# Patient Record
Sex: Male | Born: 1950 | Race: Black or African American | Hispanic: No | Smoking: Never smoker
Health system: Southern US, Community
[De-identification: ages and names within clinical notes are randomized; demographics above are authoritative.]

## PROBLEM LIST (undated history)

## (undated) DIAGNOSIS — M109 Gout, unspecified: Secondary | ICD-10-CM

## (undated) DIAGNOSIS — E785 Hyperlipidemia, unspecified: Secondary | ICD-10-CM

## (undated) DIAGNOSIS — I2510 Atherosclerotic heart disease of native coronary artery without angina pectoris: Secondary | ICD-10-CM

## (undated) HISTORY — PX: TONSILLECTOMY: SUR1361

---

## 2005-09-03 HISTORY — PX: CATARACT EXTRACTION W/ INTRAOCULAR LENS  IMPLANT, BILATERAL: SHX1307

## 2006-09-03 HISTORY — PX: CORONARY ANGIOPLASTY WITH STENT PLACEMENT: SHX49

## 2011-11-15 ENCOUNTER — Emergency Department (HOSPITAL_COMMUNITY)
Admission: EM | Admit: 2011-11-15 | Discharge: 2011-11-15 | Disposition: A | Discharge status: Home / Self Care | Payer: BC Managed Care – PPO | Attending: Emergency Medicine | Admitting: Emergency Medicine

## 2011-11-15 ENCOUNTER — Encounter (HOSPITAL_COMMUNITY): Payer: Self-pay | Admitting: *Deleted

## 2011-11-15 DIAGNOSIS — M1A9XX0 Chronic gout, unspecified, without tophus (tophi): Secondary | ICD-10-CM

## 2011-11-15 DIAGNOSIS — M109 Gout, unspecified: Secondary | ICD-10-CM | POA: Insufficient documentation

## 2011-11-15 DIAGNOSIS — J069 Acute upper respiratory infection, unspecified: Secondary | ICD-10-CM

## 2011-11-15 MED ORDER — INDOMETHACIN 25 MG PO CAPS: 25.0000 mg | ORAL_CAPSULE | Freq: Three times a day (TID) | ORAL | Status: AC | PRN

## 2011-11-15 NOTE — ED Provider Notes (Signed)
History     CSN: [id number]  Arrival date & time 11/15/11  1533   First MD Initiated Contact with Patient 11/15/11 1625      Chief Complaint  Patient presents with  . Elbow Pain  . Nasal Congestion    (Consider location/radiation/quality/duration/timing/severity/associated sxs/prior treatment) Patient is a 61 y.o. male presenting with extremity pain. The history is provided by the patient. No language interpreter was used.  Extremity Pain This is a chronic problem. The current episode started in the past 7 days. The problem occurs intermittently. The problem has been gradually worsening. Associated symptoms include congestion, coughing and a sore throat. Pertinent negatives include no chest pain, chills, fever or myalgias. The symptoms are aggravated by bending. He has tried nothing for the symptoms.  Patient with history of gout affecting elbow.  Has recently relocated to this area, does not have a PCP.  Has used indocin for gout pain in past with good results.  Also complaining of URI symptoms x 1 week.  Denies fever, chills.    Past Medical History  Diagnosis Date  . Gout     History reviewed. No pertinent past surgical history.  History reviewed. No pertinent family history.  History  Substance Use Topics  . Smoking status: Never Smoker   . Smokeless tobacco: Not on file  . Alcohol Use: Yes      Review of Systems  Constitutional: Negative for fever and chills.  HENT: Positive for congestion and sore throat.   Respiratory: Positive for cough.   Cardiovascular: Negative for chest pain.  Musculoskeletal: Negative for myalgias.  All other systems reviewed and are negative.    Allergies  Review of patient's allergies indicates not on file.  Home Medications  No current outpatient prescriptions on file.  BP 130/84  Pulse 71  Temp(Src) 98.1 F (36.7 C) (Oral)  Resp 18  SpO2 97%  Physical Exam  Nursing note and vitals reviewed. Constitutional: He appears  well-developed and well-nourished.  HENT:  Head: Normocephalic.  Eyes: Conjunctivae are normal. Pupils are equal, round, and reactive to light.  Neck: Normal range of motion.  Cardiovascular: Normal rate, regular rhythm, normal heart sounds and intact distal pulses.   Pulmonary/Chest: Effort normal and breath sounds normal.  Abdominal: Soft. Bowel sounds are normal.  Musculoskeletal: Normal range of motion. He exhibits edema and tenderness.  Lymphadenopathy:    He has no cervical adenopathy.  Skin: Skin is warm and dry.  Psychiatric: He has a normal mood and affect. His behavior is normal. Judgment and thought content normal.    ED Course  Procedures (including critical care time)  Labs Reviewed - No data to display No results found.   No diagnosis found. Gout flare. Viral URI.   MDM          Jimmye Norman, NP 11/15/11 [phone]

## 2011-11-15 NOTE — ED Provider Notes (Signed)
Medical screening examination/treatment/procedure(s) were performed by non-physician practitioner and as supervising physician I was immediately available for consultation/collaboration.  Doug Sou, MD 11/15/11 2032

## 2011-11-15 NOTE — ED Notes (Signed)
Pt reports increased pain to right elbow, reports hx of gout. Pt also reports nasal congestion over the last couple days. Pt speaking in complete sentences, pt using right arm without difficulty.

## 2011-11-15 NOTE — Discharge Instructions (Signed)
Arthritis - Gout Gout is caused by uric acid crystals forming in the joint. The big toe, foot, ankle, and knee are the joints most often affected.Often uric acid levels in the blood are also elevated. Symptoms develop rapidly, usually over hours. A joint fluid exam may be needed to prove the diagnosis. Acute gout episodes may follow a minor injury, surgery, illness or medication change. Gout occurs more often in men. It tends to be an inherited (passed down from a family member) condition. Your chance of having gout is increased if you take certain medications. These include water pills (diuretics), aspirin, niacin, and cyclosporine. Kidney disease and alcohol consumption may also increase your chances of getting gout. Months or years can pass between gout attacks.  Treatment includes ice, rest and raising the affected limb until the swelling and pain are better.Anti-inflammatory medicine usually brings about dramatic relief of pain, redness and swelling within 2 to 3 days. Other medications can also be effective. Increase your fluid intake. Do not drink alcohol or eat liver, sweetbreads, or sardines. Long-term gout management may require medicine to lower blood uric acid levels, or changing or stopping diuretics. Please see your caregiver if your condition is not better after 1 to 2 days of treatment.  SEEK IMMEDIATE MEDICAL CARE IF:  You have more serious symptoms such as a fever, skin rash, diarrhea, vomiting, or other joint pains. Document Released: 09/27/2004 Document Revised: 08/09/2011 Document Reviewed: 10/11/2008 [facility] Patient Information 2012 [place], [place].  Upper Respiratory Infection, Adult An upper respiratory infection (URI) is also known as the common cold. It is often caused by a type of germ (virus). Colds are easily spread (contagious). You can pass it to others by kissing, coughing, sneezing, or drinking out of the same glass. Usually, you get better in 1 or 2 weeks.  HOME CARE    Only take medicine as told by your doctor.   Use a warm mist humidifier or breathe in steam from a hot shower.   Drink enough water and fluids to keep your pee (urine) clear or pale yellow.   Get plenty of rest.   Return to work when your temperature is back to normal or as told by your doctor. You may use a face mask and wash your hands to stop your cold from spreading.  GET HELP RIGHT AWAY IF:   After the first few days, you feel you are getting worse.   You have questions about your medicine.   You have chills, shortness of breath, or brown or red spit (mucus).   You have yellow or brown snot (nasal discharge) or pain in the face, especially when you bend forward.   You have a fever, puffy (swollen) neck, pain when you swallow, or white spots in the back of your throat.   You have a bad headache, ear pain, sinus pain, or chest pain.   You have a high-pitched whistling sound when you breathe in and out (wheezing).   You have a lasting cough or cough up blood.   You have sore muscles or a stiff neck.  MAKE SURE YOU:   Understand these instructions.   Will watch your condition.   Will get help right away if you are not doing well or get worse.  Document Released: 02/06/2008 Document Revised: 08/09/2011 Document Reviewed: 12/25/2010 [facility] Patient Information 2012 [place], [place].  RESOURCE GUIDE  Dental Problems  Patients with Medicaid: [facility]  [facility] [address]                                           [phone] [address] [employer] [address] Phone:  [phone]                                                  Phone:  [phone]  If unable to pay or uninsured, contact:  [facility] or [facility]. to become qualified for the adult dental clinic.  Chronic Pain Problems Contact [facility]  [phone] Patients need to be referred by their primary care doctor.  Insufficient Money for  Medicine Contact [employer] Way:  call "211" or [facility] [phone].  No Primary Care Doctor Call [facility]  [phone] Other agencies that provide inexpensive medical care    [facility]  [phone]    [facility]  [phone]    [facility]  [phone]    [facility]  [phone]    [facility]  [phone]    [facility]  [phone]  Psychological Services [facility] Behavioral [facility]  [phone] [facility]  [phone] [facility] Mental Health   6691699259 (emergency services [phone])  Substance Abuse Resources [facility]  [phone] [facility] [phone] [facility] [phone] Floydene Flock [phone] Residential & Outpatient Substance Abuse Program  2315175097  Abuse/Neglect [facility] Child Abuse Hotline 2628539627 [facility] Child Abuse Hotline 808-855-3793 (After Hours)  Emergency Shelter [facility] 7811651413  Maternity Homes [facility] [phone] [facility] [phone]  MRSA Hotline #:   [phone]    [facility] Resources  [facility] [place] [facility]     United [employer]                          [facility] Dept. [address] [place]                       [address]      [address]  Blondell Reveal Phone:  [phone]                                   Phone:  [phone]                 Phone:  [phone]  [facility] Phone:  [phone]  [facility] Child Abuse Hotline (619)318-2197 707-416-0472 (After Hours)   Take indocin as directed.  You may want to try Robitussin DM for cough.  Use the resource  information provided to help locate an primary care provider.

## 2012-07-23 ENCOUNTER — Encounter (HOSPITAL_COMMUNITY): Payer: Self-pay | Admitting: Physical Medicine and Rehabilitation

## 2012-07-23 ENCOUNTER — Emergency Department (HOSPITAL_COMMUNITY)
Admission: EM | Admit: 2012-07-23 | Discharge: 2012-07-23 | Disposition: A | Discharge status: Home / Self Care | Payer: BC Managed Care – PPO | Attending: Emergency Medicine | Admitting: Emergency Medicine

## 2012-07-23 DIAGNOSIS — M25569 Pain in unspecified knee: Secondary | ICD-10-CM | POA: Insufficient documentation

## 2012-07-23 DIAGNOSIS — Z7982 Long term (current) use of aspirin: Secondary | ICD-10-CM | POA: Insufficient documentation

## 2012-07-23 DIAGNOSIS — M109 Gout, unspecified: Secondary | ICD-10-CM

## 2012-07-23 MED ORDER — INDOMETHACIN 25 MG PO CAPS
50.0000 mg | ORAL_CAPSULE | Freq: Once | ORAL | Status: AC
  Administered 2012-07-23: 50 mg via ORAL
  Filled 2012-07-23 (×2): qty 2

## 2012-07-23 MED ORDER — INDOMETHACIN 50 MG PO CAPS: 50.0000 mg | ORAL_CAPSULE | Freq: Three times a day (TID) | ORAL | Status: DC

## 2012-07-23 NOTE — ED Notes (Signed)
Patient says he was diagnosed with gout since he was 30.  He started feeling pain in his right shoulder and his right knee.  Patient said he just recently moved here from [place] and does not have a primary care physician and his pain is so severe he decided to come in.

## 2012-07-23 NOTE — ED Provider Notes (Signed)
History    This chart was scribed for Adrian Guppy, MD, MD by Smitty Pluck, ED Scribe. The patient was seen in room TR05C and the patient's care was started at 5:37PM.   CSN: [id number]  Arrival date & time 07/23/12  1625   None     Chief Complaint  Patient presents with  . Shoulder Pain  . Knee Pain    (Consider location/radiation/quality/duration/timing/severity/associated sxs/prior treatment) Patient is a 61 y.o. male presenting with shoulder pain and knee pain. The history is provided by the patient. No language interpreter was used.  Shoulder Pain Pertinent negatives include no shortness of breath.  Knee Pain Pertinent negatives include no shortness of breath.   Naszir [name] is a 61 y.o. male with hx of gout who presents to the Emergency Department complaining of constant, moderate right shoulder pain and right knee pain onset 2 days ago. He states that he normally has gout flare ups. Pt denies trauma to shoulder and knee. He reports that he ran out of gout medication 1 day ago.  He denies fever, chills, nausea, vomiting and any other pain.  Past Medical History  Diagnosis Date  . Gout     Past Surgical History  Procedure Date  . Coronary angioplasty with stent placement     History reviewed. No pertinent family history.  History  Substance Use Topics  . Smoking status: Never Smoker   . Smokeless tobacco: Not on file  . Alcohol Use: Yes      Review of Systems  Constitutional: Negative for fever and chills.  Respiratory: Negative for shortness of breath.   Gastrointestinal: Negative for nausea and vomiting.  Neurological: Negative for weakness.  All other systems reviewed and are negative.    Allergies  Review of patient's allergies indicates no known allergies.  Home Medications   Current Outpatient Rx  Name  Route  Sig  Dispense  Refill  . ASPIRIN EC 81 MG PO TBEC   Oral   Take 81 mg by mouth every morning.         Marland Kitchen NAPROXEN 375 MG PO  TABS   Oral   Take 375 mg by mouth 2 (two) times daily as needed. For gout           BP 132/86  Pulse 66  Temp 98.7 F (37.1 C) (Oral)  Resp 18  SpO2 100%  Physical Exam  Nursing note and vitals reviewed.   ED Course  Procedures (including critical care time) DIAGNOSTIC STUDIES: Oxygen Saturation is 100% on room air, normal by my interpretation.    COORDINATION OF CARE: 5:40 PM Discussed ED treatment with pt  5:40 PM Ordered:    . indomethacin  50 mg Oral Once       Labs Reviewed - No data to display No results found.   No diagnosis found.    MDM  gout      I personally performed the services described in this documentation, which was scribed in my presence. The recorded information has been reviewed and is accurate.     Adrian Guppy, MD 07/23/12 1742

## 2012-07-23 NOTE — ED Notes (Signed)
Pt presents to department for evaluation of R shoulder and R knee pain. States history of gout. He is new to town and does not have PCP, states he ran out of medication (Indomethacin). 10/10 pain upon arrival. Pt is conscious alert and oriented x4. Ambulatory to triage. NAD.

## 2013-07-15 ENCOUNTER — Emergency Department (HOSPITAL_COMMUNITY): Payer: BC Managed Care – PPO

## 2013-07-15 ENCOUNTER — Observation Stay (HOSPITAL_COMMUNITY)
Admission: EM | Admit: 2013-07-15 | Discharge: 2013-07-17 | Disposition: A | Discharge status: Home / Self Care | Payer: BC Managed Care – PPO | Attending: Cardiovascular Disease | Admitting: Cardiovascular Disease

## 2013-07-15 ENCOUNTER — Encounter (HOSPITAL_COMMUNITY): Payer: Self-pay | Admitting: Emergency Medicine

## 2013-07-15 DIAGNOSIS — R0602 Shortness of breath: Secondary | ICD-10-CM | POA: Insufficient documentation

## 2013-07-15 DIAGNOSIS — I209 Angina pectoris, unspecified: Secondary | ICD-10-CM

## 2013-07-15 DIAGNOSIS — I200 Unstable angina: Secondary | ICD-10-CM | POA: Insufficient documentation

## 2013-07-15 DIAGNOSIS — Z7902 Long term (current) use of antithrombotics/antiplatelets: Secondary | ICD-10-CM | POA: Insufficient documentation

## 2013-07-15 DIAGNOSIS — E785 Hyperlipidemia, unspecified: Secondary | ICD-10-CM | POA: Diagnosis present

## 2013-07-15 DIAGNOSIS — Z9861 Coronary angioplasty status: Secondary | ICD-10-CM | POA: Insufficient documentation

## 2013-07-15 DIAGNOSIS — I208 Other forms of angina pectoris: Secondary | ICD-10-CM | POA: Insufficient documentation

## 2013-07-15 DIAGNOSIS — I2510 Atherosclerotic heart disease of native coronary artery without angina pectoris: Principal | ICD-10-CM | POA: Insufficient documentation

## 2013-07-15 HISTORY — DX: Atherosclerotic heart disease of native coronary artery without angina pectoris: I25.10

## 2013-07-15 HISTORY — DX: Hyperlipidemia, unspecified: E78.5

## 2013-07-15 HISTORY — DX: Gout, unspecified: M10.9

## 2013-07-15 LAB — BASIC METABOLIC PANEL
BUN: 14 mg/dL (ref 6–23)
CO2: 22 mEq/L (ref 19–32)
Calcium: 9.3 mg/dL (ref 8.4–10.5)
Chloride: 102 mEq/L (ref 96–112)
Creatinine, Ser: 1.17 mg/dL (ref 0.50–1.35)
GFR calc Af Amer: 75 mL/min — ABNORMAL LOW (ref >90)
GFR calc non Af Amer: 65 mL/min — ABNORMAL LOW (ref >90)
Glucose, Bld: 89 mg/dL (ref 70–99)
Potassium: 4.1 mEq/L (ref 3.5–5.1)
Sodium: 137 mEq/L (ref 135–145)

## 2013-07-15 LAB — CBC
Hemoglobin: 14.7 g/dL (ref 13.0–17.0)
MCH: 33 pg (ref 26.0–34.0)
MCHC: 35.1 g/dL (ref 30.0–36.0)
MCHC: 34.7 g/dL (ref 30.0–36.0)
MCV: 93.9 fL (ref 78.0–100.0)
Platelets: 142 10*3/uL — ABNORMAL LOW (ref 150–400)
RBC: 4.54 MIL/uL (ref 4.22–5.81)
RDW: 13.2 % (ref 11.5–15.5)
WBC: 3.2 10*3/uL — ABNORMAL LOW (ref 4.0–10.5)

## 2013-07-15 LAB — CREATININE, SERUM
Creatinine, Ser: 1.25 mg/dL (ref 0.50–1.35)
GFR calc Af Amer: 70 mL/min — ABNORMAL LOW (ref >90)
GFR calc non Af Amer: 60 mL/min — ABNORMAL LOW (ref >90)

## 2013-07-15 LAB — TROPONIN I: Troponin I: <0.3 ng/mL (ref <0.30)

## 2013-07-15 LAB — POCT I-STAT TROPONIN I: Troponin i, poc: 0.01 ng/mL (ref 0.00–0.08)

## 2013-07-15 LAB — PRO B NATRIURETIC PEPTIDE: Pro B Natriuretic peptide (BNP): 53.1 pg/mL (ref 0–125)

## 2013-07-15 MED ORDER — ASPIRIN 81 MG PO CHEW
324.0000 mg | CHEWABLE_TABLET | ORAL | Status: AC
  Administered 2013-07-15: 324 mg via ORAL
  Filled 2013-07-15: qty 4

## 2013-07-15 MED ORDER — ATORVASTATIN CALCIUM 10 MG PO TABS
10.0000 mg | ORAL_TABLET | Freq: Every day | ORAL | Status: DC
  Administered 2013-07-15: 10 mg via ORAL
  Filled 2013-07-15 (×3): qty 1

## 2013-07-15 MED ORDER — ONDANSETRON HCL 4 MG/2ML IJ SOLN: 4.0000 mg | Freq: Four times a day (QID) | INTRAMUSCULAR | Status: DC | PRN

## 2013-07-15 MED ORDER — ASPIRIN EC 81 MG PO TBEC
81.0000 mg | DELAYED_RELEASE_TABLET | Freq: Every day | ORAL | Status: DC
  Administered 2013-07-17: 10:00:00 81 mg via ORAL
  Filled 2013-07-15 (×2): qty 1

## 2013-07-15 MED ORDER — ACETAMINOPHEN 325 MG PO TABS: 650.0000 mg | ORAL_TABLET | ORAL | Status: DC | PRN

## 2013-07-15 MED ORDER — ASPIRIN 300 MG RE SUPP: 300.0000 mg | RECTAL | Status: AC

## 2013-07-15 MED ORDER — ZOLPIDEM TARTRATE 5 MG PO TABS: 5.0000 mg | ORAL_TABLET | Freq: Every evening | ORAL | Status: DC | PRN

## 2013-07-15 MED ORDER — COLCHICINE 0.6 MG PO TABS
0.6000 mg | ORAL_TABLET | Freq: Three times a day (TID) | ORAL | Status: DC | PRN
  Filled 2013-07-15: qty 1

## 2013-07-15 MED ORDER — ALPRAZOLAM 0.25 MG PO TABS: 0.2500 mg | ORAL_TABLET | Freq: Two times a day (BID) | ORAL | Status: DC | PRN

## 2013-07-15 MED ORDER — NITROGLYCERIN 0.4 MG SL SUBL: 0.4000 mg | SUBLINGUAL_TABLET | SUBLINGUAL | Status: DC | PRN

## 2013-07-15 MED ORDER — ENOXAPARIN SODIUM 40 MG/0.4ML ~~LOC~~ SOLN
40.0000 mg | SUBCUTANEOUS | Status: DC
  Administered 2013-07-15: 40 mg via SUBCUTANEOUS
  Filled 2013-07-15 (×2): qty 0.4

## 2013-07-15 NOTE — ED Provider Notes (Signed)
CSN: [id number]     Arrival date & time 07/15/13  1114 History   First MD Initiated Contact with Patient 07/15/13 1124     Chief Complaint  Patient presents with  . Chest Pain   (Consider location/radiation/quality/duration/timing/severity/associated sxs/prior Treatment) HPI Comments: Patient presents emergency department with chief complaint of chest pain. He states that he has had chest pain for the past several days, but that he only gets it when he is exercising. He also endorses shortness of breath with exercising. Patient states that he does have a history of CAD, and had a stent placed in 2008. This was done in [place]. He does not have a cardiologist here. Currently, patient denies any chest pain or shortness of breath. He states that he does want to be evaluated. He takes an aspirin daily. States that the symptoms are always worsened with exercising or stress, and they're always relieved with rest.  The history is provided by the patient. No language interpreter was used.    Past Medical History  Diagnosis Date  . Gout   . Coronary artery disease   . Gout    Past Surgical History  Procedure Laterality Date  . Coronary angioplasty with stent placement     No family history on file. History  Substance Use Topics  . Smoking status: Never Smoker   . Smokeless tobacco: Not on file  . Alcohol Use: Yes    Review of Systems  All other systems reviewed and are negative.    Allergies  Review of patient's allergies indicates no known allergies.  Home Medications   Current Outpatient Rx  Name  Route  Sig  Dispense  Refill  . aspirin EC 81 MG tablet   Oral   Take 81 mg by mouth every morning.         . colchicine 0.6 MG tablet   Oral   Take 0.6 mg by mouth 3 (three) times daily as needed. For gout         . indomethacin (INDOCIN) 50 MG capsule   Oral   Take 1 capsule (50 mg total) by mouth 3 (three) times daily with meals.   60 capsule   6    BP 122/84  Pulse  64  Temp(Src) 97.4 F (36.3 C) (Oral)  Resp 18  SpO2 99% Physical Exam  Nursing note and vitals reviewed. Constitutional: He is oriented to person, place, and time. He appears well-developed and well-nourished.  HENT:  Head: Normocephalic and atraumatic.  Right Ear: External ear normal.  Left Ear: External ear normal.  Nose: Nose normal.  Mouth/Throat: Oropharynx is clear and moist. No oropharyngeal exudate.  Eyes: Conjunctivae and EOM are normal. Pupils are equal, round, and reactive to light. Right eye exhibits no discharge. Left eye exhibits no discharge. No scleral icterus.  Neck: Normal range of motion. Neck supple. No JVD present.  Cardiovascular: Normal rate, regular rhythm, normal heart sounds and intact distal pulses.  Exam reveals no gallop and no friction rub.   No murmur heard. Pulmonary/Chest: Effort normal and breath sounds normal. No respiratory distress. He has no wheezes. He has no rales. He exhibits no tenderness.  Abdominal: Soft. Bowel sounds are normal. He exhibits no distension and no mass. There is no tenderness. There is no rebound and no guarding.  Musculoskeletal: Normal range of motion. He exhibits no edema and no tenderness.  Neurological: He is alert and oriented to person, place, and time.  Skin: Skin is warm and dry.  Psychiatric: He has a normal mood and affect. His behavior is normal. Judgment and thought content normal.    ED Course  Procedures (including critical care time) Results for orders placed during the hospital encounter of 07/15/13  CBC      Result Value Range   WBC 3.3 (*) 4.0 - 10.5 K/uL   RBC 4.79  4.22 - 5.81 MIL/uL   Hemoglobin 15.8  13.0 - 17.0 g/dL   HCT 16.1  09.6 - 04.5 %   MCV 93.9  78.0 - 100.0 fL   MCH 33.0  26.0 - 34.0 pg   MCHC 35.1  30.0 - 36.0 g/dL   RDW 40.9  81.1 - 91.4 %   Platelets 142 (*) 150 - 400 K/uL  BASIC METABOLIC PANEL      Result Value Range   Sodium 137  135 - 145 mEq/L   Potassium 4.1  3.5 - 5.1  mEq/L   Chloride 102  96 - 112 mEq/L   CO2 22  19 - 32 mEq/L   Glucose, Bld 89  70 - 99 mg/dL   BUN 14  6 - 23 mg/dL   Creatinine, Ser 7.82  0.50 - 1.35 mg/dL   Calcium 9.3  8.4 - 95.6 mg/dL   GFR calc non Af Amer 65 (*) >90 mL/min   GFR calc Af Amer 75 (*) >90 mL/min  PRO B NATRIURETIC PEPTIDE      Result Value Range   Pro B Natriuretic peptide (BNP) 53.1  0 - 125 pg/mL  POCT I-STAT TROPONIN I      Result Value Range   Troponin i, poc 0.01  0.00 - 0.08 ng/mL   Comment 3            Dg Chest 2 View  07/15/2013   CLINICAL DATA:  Chest pain and dyspnea  EXAM: CHEST  2 VIEW  COMPARISON:  None.  FINDINGS: The lungs are borderline hypoinflated. There is no focal infiltrate. The cardiac silhouette is top-normal in size. The pulmonary vascularity is not engorged. There is no pleural effusion or pneumothorax. There is calcification of the anterior longitudinal ligament of the thoracic spine.  IMPRESSION: There is no evidence of pneumonia or CHF. The lungs appear mildly hypoinflated.   Electronically Signed   By: David  [place]   On: 07/15/2013 13:07      EKG Interpretation   None     ED ECG REPORT  I personally interpreted this EKG   Date: 07/15/2013   Rate: 62  Rhythm: normal sinus rhythm  QRS Axis: normal  Intervals: normal  ST/T Wave abnormalities: normal  Conduction Disutrbances:none  Narrative Interpretation: Possible anterior infarct, age undertermined  Old EKG Reviewed: none available    MDM   1. Angina of effort     Patient with chest pain which is exertional. He's not having any chest pain now. Check basic labs, EKG, and chest x-ray. Will reevaluate.  Labs are reassuring.  Patient discussed with Dr. Wilkie Aye.  Will call cardiology for consult.  Patient will be admitted by cardiology, Dr. Eden Emms.  Cath in the morning.    Roxy Horseman, PA-C 07/15/13 1523

## 2013-07-15 NOTE — ED Notes (Signed)
Cp with exersizing and sob x 3 days pt states that it comes and goes has a stent 08 ( has had no f/u since )

## 2013-07-15 NOTE — H&P (Signed)
History and Physical   Patient ID: Divonte Senger MRN: [id number], DOB/AGE: 62-Oct-1952 63 y.o. Date of Encounter: 07/15/2013  Primary Physician: None here Primary Cardiologist: New  Chief Complaint:  Chest pain  HPI: Less Woolsey is a 62 y.o. male with a history of CAD. Stenting in 2008, no cath or stress test since then.   He recently moved to [place]. He has started back exercising, after not exercising in over a year. For the last 2 weeks, he has been walking 5 miles at a time and also doing some weights and abdominal exercises. He is also active around the house and yard, mowing and using a weedeater.  Ever since he started increasing his activity, he has noticed chest tightness with exertion. A 3/10,  The tightness has been coming on pretty consistently with exertion and resolves with rest. He has had no resting pain. If he thinks about the situation when he did not get chest tightness, he will realize he was not exerting himself quite as much. He gets shortness of breath with the chest tightness, which does not exceed a 5/10. He had chest tightness 2 days ago while doing his wall and then again last p.m., during sex. He and his girlfriend were concerned and she persuaded him to come to the emergency room today.    Past Medical History  Diagnosis Date  . Gout   . Coronary artery disease 2008    Slight heart attack, > 90% "main artery", s/p stent    Surgical History:  Past Surgical History  Procedure Laterality Date  . Coronary angioplasty with stent placement  2008    [facility] [place], 1 stent, possibly to the LAD     I have reviewed the patient's current medications. Prior to Admission medications   Medication Sig Start Date End Date Taking? Authorizing Provider  aspirin EC 81 MG tablet Take 81 mg by mouth every morning.   Yes Historical Provider, MD  colchicine 0.6 MG tablet Take 0.6 mg by mouth 3 (three) times daily as needed. For gout   Yes  Historical Provider, MD  indomethacin (INDOCIN) 50 MG capsule Take 1 capsule (50 mg total) by mouth 3 (three) times daily with meals. 07/23/12  Yes Cheri Guppy, MD   Allergies: No Known Allergies  History   Social History  . Marital Status: Divorced    Spouse Name: N/A    Number of Children: N/A  . Years of Education: N/A   Occupational History  . Retired 31 years with [employer]    Social History Main Topics  . Smoking status: Never Smoker   . Smokeless tobacco: Not on file  . Alcohol Use: Yes     Comment: 1-2 glasses on the weekends  . Drug Use: No  . Sexual Activity: Not on file   Other Topics Concern  . Not on file   Social History Narrative   Pt lives alone. Daughter lives in the area.    Family History  Problem Relation Age of Onset  . Heart attack Father   . Coronary artery disease Mother    Family Status  Relation Status Death Age  . Mother Deceased 68    Hx CAD  . Father Deceased 72    MI    Review of Systems:   Full 14-point review of systems otherwise negative except as noted above.  Physical Exam: Blood pressure 122/84, pulse 64, temperature 97.4 F (36.3 C), temperature source Oral, resp. rate  18, SpO2 99.00%. General: Well developed, well nourished,male in no acute distress. Head: Normocephalic, atraumatic, sclera non-icteric, no xanthomas, nares are without discharge. Dentition: good Neck: No carotid bruits. JVD not elevated. No thyromegally Lungs: Good expansion bilaterally. without wheezes or rhonchi.  Heart: Regular rate and rhythm with S1 S2.  No S3 or S4.  No murmur, no rubs, or gallops appreciated. Abdomen: Soft, non-tender, non-distended with normoactive bowel sounds. No hepatomegaly. No rebound/guarding. No obvious abdominal masses. Msk:  Strength and tone appear normal for age. No joint deformities or effusions, no spine or costo-vertebral angle tenderness. Extremities: No clubbing or cyanosis. No edema.  Distal pedal pulses are 2+ in 4  extrem Neuro: Alert and oriented X 3. Moves all extremities spontaneously. No focal deficits noted. Psych:  Responds to questions appropriately with a normal affect. Skin: No rashes or lesions noted  Labs:  Lab Results  Component Value Date   WBC 3.3* 07/15/2013   HGB 15.8 07/15/2013   HCT 45.0 07/15/2013   MCV 93.9 07/15/2013   PLT 142* 07/15/2013     Recent Labs Lab 07/15/13 1145  NA 137  K 4.1  CL 102  CO2 22  BUN 14  CREATININE 1.17  CALCIUM 9.3  GLUCOSE 89   No results found for this basename: CKTOTAL, CKMB, TROPONINI,  in the last 72 hours  Recent Labs  07/15/13 1213  TROPIPOC 0.01   Pro B Natriuretic peptide (BNP)  Date/Time Value Range Status  07/15/2013 11:45 AM 53.1  0 - 125 pg/mL Final    Radiology/Studies: Dg Chest 2 View 07/15/2013   CLINICAL DATA:  Chest pain and dyspnea  EXAM: CHEST  2 VIEW  COMPARISON:  None.  FINDINGS: The lungs are borderline hypoinflated. There is no focal infiltrate. The cardiac silhouette is top-normal in size. The pulmonary vascularity is not engorged. There is no pleural effusion or pneumothorax. There is calcification of the anterior longitudinal ligament of the thoracic spine.  IMPRESSION: There is no evidence of pneumonia or CHF. The lungs appear mildly hypoinflated.   Electronically Signed   By: David  [place]   On: 07/15/2013 13:07   ECG:  SR, no ST elevation.  ASSESSMENT AND PLAN:  Active Problems:   Angina of effort - admit, r/o MI. His symptoms are concerning for progressive angina, cath is best option. Will schedule for AM. Screen for CRF control. Chol: Unknown check fasting lipid profile.   Thrombocytopenia:  Denies ETOH excess no splenomegaly follow

## 2013-07-16 ENCOUNTER — Encounter (HOSPITAL_COMMUNITY)
Admission: EM | Disposition: A | Discharge status: Home / Self Care | Payer: BC Managed Care – PPO | Source: Home / Self Care | Attending: Cardiovascular Disease

## 2013-07-16 DIAGNOSIS — I2510 Atherosclerotic heart disease of native coronary artery without angina pectoris: Secondary | ICD-10-CM

## 2013-07-16 HISTORY — PX: LEFT HEART CATHETERIZATION WITH CORONARY ANGIOGRAM: SHX5451

## 2013-07-16 LAB — PROTIME-INR
INR: 1.09 (ref 0.00–1.49)
Prothrombin Time: 13.9 seconds (ref 11.6–15.2)

## 2013-07-16 LAB — LIPID PANEL
Cholesterol: 261 mg/dL — ABNORMAL HIGH (ref 0–200)
Triglycerides: 111 mg/dL (ref <150)
VLDL: 22 mg/dL (ref 0–40)

## 2013-07-16 LAB — APTT: aPTT: 33 seconds (ref 24–37)

## 2013-07-16 LAB — TROPONIN I: Troponin I: <0.3 ng/mL (ref <0.30)

## 2013-07-16 SURGERY — LEFT HEART CATHETERIZATION WITH CORONARY ANGIOGRAM
Anesthesia: LOCAL

## 2013-07-16 MED ORDER — BIVALIRUDIN 250 MG IV SOLR
INTRAVENOUS | Status: AC
  Filled 2013-07-16: qty 250

## 2013-07-16 MED ORDER — SODIUM CHLORIDE 0.9 % IV SOLN: 250.0000 mL | INTRAVENOUS | Status: DC | PRN

## 2013-07-16 MED ORDER — FAMOTIDINE IN NACL 20-0.9 MG/50ML-% IV SOLN
INTRAVENOUS | Status: AC
  Filled 2013-07-16: qty 50

## 2013-07-16 MED ORDER — SODIUM CHLORIDE 0.9 % IV SOLN
INTRAVENOUS | Status: DC
  Administered 2013-07-16: 06:00:00 via INTRAVENOUS

## 2013-07-16 MED ORDER — MIDAZOLAM HCL 2 MG/2ML IJ SOLN
INTRAMUSCULAR | Status: AC
  Filled 2013-07-16: qty 2

## 2013-07-16 MED ORDER — HEPARIN (PORCINE) IN NACL 2-0.9 UNIT/ML-% IJ SOLN
INTRAMUSCULAR | Status: AC
  Filled 2013-07-16: qty 1500

## 2013-07-16 MED ORDER — ACETAMINOPHEN 325 MG PO TABS: 650.0000 mg | ORAL_TABLET | ORAL | Status: DC | PRN

## 2013-07-16 MED ORDER — FENTANYL CITRATE 0.05 MG/ML IJ SOLN
INTRAMUSCULAR | Status: AC
  Filled 2013-07-16: qty 2

## 2013-07-16 MED ORDER — HEPARIN (PORCINE) IN NACL 100-0.45 UNIT/ML-% IJ SOLN
1000.0000 [IU]/h | INTRAMUSCULAR | Status: DC
  Filled 2013-07-16: qty 250

## 2013-07-16 MED ORDER — HEPARIN (PORCINE) IN NACL 2-0.9 UNIT/ML-% IJ SOLN
INTRAMUSCULAR | Status: AC
  Filled 2013-07-16: qty 1000

## 2013-07-16 MED ORDER — HEPARIN SODIUM (PORCINE) 1000 UNIT/ML IJ SOLN
INTRAMUSCULAR | Status: AC
  Filled 2013-07-16: qty 1

## 2013-07-16 MED ORDER — SODIUM CHLORIDE 0.9 % IJ SOLN
3.0000 mL | Freq: Two times a day (BID) | INTRAMUSCULAR | Status: DC
  Administered 2013-07-16: 3 mL via INTRAVENOUS

## 2013-07-16 MED ORDER — NITROGLYCERIN 0.2 MG/ML ON CALL CATH LAB
INTRAVENOUS | Status: AC
  Filled 2013-07-16: qty 1

## 2013-07-16 MED ORDER — CLOPIDOGREL BISULFATE 300 MG PO TABS
ORAL_TABLET | ORAL | Status: AC
  Filled 2013-07-16: qty 1

## 2013-07-16 MED ORDER — CLOPIDOGREL BISULFATE 75 MG PO TABS
75.0000 mg | ORAL_TABLET | Freq: Every day | ORAL | Status: DC
  Administered 2013-07-17: 09:00:00 75 mg via ORAL
  Filled 2013-07-16: qty 1

## 2013-07-16 MED ORDER — LIDOCAINE HCL (PF) 1 % IJ SOLN
INTRAMUSCULAR | Status: AC
  Filled 2013-07-16: qty 30

## 2013-07-16 MED ORDER — SODIUM CHLORIDE 0.9 % IV SOLN: INTRAVENOUS | Status: AC

## 2013-07-16 MED ORDER — ASPIRIN 81 MG PO CHEW: 324.0000 mg | CHEWABLE_TABLET | ORAL | Status: DC

## 2013-07-16 MED ORDER — SODIUM CHLORIDE 0.9 % IJ SOLN: 3.0000 mL | INTRAMUSCULAR | Status: DC | PRN

## 2013-07-16 MED ORDER — ASPIRIN 81 MG PO CHEW
324.0000 mg | CHEWABLE_TABLET | ORAL | Status: AC
  Administered 2013-07-16: 324 mg via ORAL
  Filled 2013-07-16 (×2): qty 4

## 2013-07-16 MED ORDER — NITROGLYCERIN IN D5W 200-5 MCG/ML-% IV SOLN
INTRAVENOUS | Status: AC
  Filled 2013-07-16: qty 250

## 2013-07-16 MED ORDER — MORPHINE SULFATE 2 MG/ML IJ SOLN: 2.0000 mg | INTRAMUSCULAR | Status: DC | PRN

## 2013-07-16 MED ORDER — ONDANSETRON HCL 4 MG/2ML IJ SOLN: 4.0000 mg | Freq: Four times a day (QID) | INTRAMUSCULAR | Status: DC | PRN

## 2013-07-16 MED ORDER — VERAPAMIL HCL 2.5 MG/ML IV SOLN
INTRAVENOUS | Status: AC
  Filled 2013-07-16: qty 2

## 2013-07-16 MED ORDER — OXYCODONE-ACETAMINOPHEN 5-325 MG PO TABS
1.0000 | ORAL_TABLET | ORAL | Status: DC | PRN
  Administered 2013-07-16: 1 via ORAL
  Filled 2013-07-16: qty 1

## 2013-07-16 NOTE — Interval H&P Note (Signed)
History and Physical Interval Note:  07/16/2013 11:02 AM  Adrian [name]  has presented today for surgery, with the diagnosis of USACath Lab Visit (complete for each Cath Lab visit)  Clinical Evaluation Leading to the Procedure:   ACS: yes  Non-ACS:    Anginal Classification: CCS III  Anti-ischemic medical therapy: Minimal Therapy (1 class of medications)  Non-Invasive Test Results: No non-invasive testing performed  Prior CABG: No previous CABG       The various methods of treatment have been discussed with the patient and family. After consideration of risks, benefits and other options for treatment, the patient has consented to  Procedure(s): LEFT HEART CATHETERIZATION WITH CORONARY ANGIOGRAM (N/A) as a surgical intervention .  The patient's history has been reviewed, patient examined, no change in status, stable for surgery.  I have reviewed the patient's chart and labs.  Questions were answered to the patient's satisfaction.     Adrian [name]

## 2013-07-16 NOTE — CV Procedure (Signed)
Cardiac Cath Procedure Note:  Indication: [place]  Procedures performed:  1) Selective coronary angiography 2) Left heart catheterization 3) Left ventriculogram  Description of procedure:   The risks and indication of the procedure were explained. Consent was signed and placed on the chart. An appropriate timeout was taken prior to the procedure. After a normal Allen's test was confirmed, the right wrist was prepped and draped in the routine sterile fashion and anesthetized with 1% local lidocaine.   A 5 FR arterial sheath was then placed in the right radial artery using a modified Seldinger technique. Systemic heparin was administered. 3mg  IV verapamil was given through the sheath. Standard catheters including a JL 3.5, JR4 and straight pigtail were used. All catheter exchanges were made over a wire.  Complications:  None apparent  Findings:  Ao Pressure: 130/76/98 LV Pressure: 136/15/19 There was no signficant gradient across the aortic valve on pullback.  Left main: Normal  LAD: Long vessel courses to apex. 80% proximal lesion. Diffuse 30% plaque through midvessel with focal intramyocardial segment. Distal vessel with moderate diffuse disease. D1 small 95% ostial. Diffuse disease. D2,D3,D4 small without high-grad stenosis.   LCX: Moderate to large sized co-dominant vessel. AV groove 30-40% lesion in midsection .OM-1 long moderate-sized vessel.with long 50-60% lesion proximally in stent. OM-2 long moderate-sized vessel 99% ostial lesion followed by long 80-90% diffusely diseased segment in proximal to mid section. Moderate plaque in distal vessel. OM-3 small 60% ostial. PL and PDA very small.   RCA: Small co-dominant vessel. 30% lesion prior to PDA. Mild disease distally  LV-gram done in the RAO projection: Ejection fraction = 45-50% with HK basal to mid inferior wall  Assessment: 1) 2V CAD 2) EF 45-50%   Plan/Discussion:  Plan PCI of LAD and OM-2 with Dr. Clifton James. Load  Plavix. Aggressive RF modification.   Adrian [name] 12:02 PM

## 2013-07-16 NOTE — Progress Notes (Signed)
ANTICOAGULATION CONSULT NOTE - Initial Consult  Pharmacy Consult for heparin Indication: chest pain/ACS  No Known Allergies  Patient Measurements: Height: 5\' 10"  (177.8 cm) Weight: 208 lb 3.2 oz (94.439 kg) IBW/kg (Calculated) : 73 Heparin Dosing Weight: 85kg  Vital Signs: Temp: 98 F (36.7 C) (11/13 0501) Temp src: Oral (11/13 0501) BP: 122/69 mmHg (11/13 0501) Pulse Rate: 49 (11/13 1118)  Labs:  Recent Labs  07/15/13 0315 07/15/13 1145 07/15/13 1532 07/16/13 0230  HGB  --  15.8 14.7  --   HCT  --  45.0 42.4  --   PLT  --  142* 124*  --   APTT  --   --   --  33  LABPROT  --   --   --  13.9  INR  --   --   --  1.09  CREATININE  --  1.17 1.25  --   TROPONINI <0.30  --  <0.30 <0.30    Estimated Creatinine Clearance: 70.7 ml/min (by C-G formula based on Cr of 1.25).   Medical History: Past Medical History  Diagnosis Date  . Gout   . Coronary artery disease 2008    Slight heart attack, > 90% "main artery", s/p stent  . Myocardial infarction ~ 2008    "mild; that's when I got my stent" (07/15/2013)  Assessment: 62 y.o. male with a history of CAD. Stenting in 2008. Received orders from cath lab to start IV heparin after diagnostic cath revealed 2v CAD. Plan is for PCI of LAD/OM. CBC within normal limits, no bolus will be given as patient received heparin during cath.  Goal of Therapy:  Heparin level 0.3-0.7 units/ml Monitor platelets by anticoagulation protocol: Yes   Plan:  Start heparin infusion at 1000 units/hr Check anti-Xa level in 6 hours and daily while on heparin Continue to monitor H&H and platelets  Sheppard Coil PharmD., BCPS Clinical Pharmacist Pager [phone] 07/16/2013 2:52 PM

## 2013-07-16 NOTE — ED Provider Notes (Signed)
Medical screening examination/treatment/procedure(s) were performed by non-physician practitioner and as supervising physician I was immediately available for consultation/collaboration.  EKG Interpretation     Ventricular Rate:    PR Interval:    QRS Duration:   QT Interval:    QTC Calculation:   R Axis:     Text Interpretation:             EKG: Sinus rhythm, possible old anterior infarct, no prior for comparison    Shon Baton, MD 07/16/13 [phone]

## 2013-07-16 NOTE — CV Procedure (Signed)
     Cardiac Catheterization Operative Report  Adrian [name] [id number] 11/13/20143:05 PM No PCP Per Patient  Procedure Performed:  1. PTCA/DES x 1 proximal LAD 2. PTCA/DES x 1 OM2   Operator: Verne Carrow, MD  Indication:  62 yo male with unstable angina. Diagnostic cath this am per Dr. Gala Romney with severe stenosis proximal LAD and severe stenosis second OM branch. Both vessels were felt to be severely diseased and potential culprits for his angina.                                     Procedure Details: The risks, benefits, complications, treatment options, and expected outcomes were discussed with the patient. The patient and/or family concurred with the proposed plan, giving informed consent before the diagnostic portion of the procedure. The patient was brought to the cath lab from the holding area where he was taken after his diagnostic cath. A 5/6 French sheath was present in the right radial artery. The patient was further sedated with Versed and Fentanyl. The right wrist was prepped and draped in a sterile fashion. The sheath was exchanged for another 5/6 slender sheath. He was given a weight based bolus of Angiomax and a drip was started. He had been previously loaded with Plavix 600 mg po x 1 earlier today. When the ACT was over 200, I engaged the left main with a XB LAD 3.5 guiding catheter. I then passed a Cougar IC wire down the LAD. A 2.5 x 12 mm balloon was used to pre-dilate the severe stenosis in the proximal LAD. I then carefully positioned and deployed a 3.0 x 18 mm Xience DES in the proximal LAD. The stent was post-dilated with a 3.25 x 15 mm [place] balloon. The hazy stenosis was taken from 80% down to 0%. I then turned by attention to the stenosis in the second OM branch. The same Cougar wire was advanced into the second OM branch. A 2.0 x 20 mm balloon was inflated x 2. I then carefully positioned and deployed a 2.25 x 32 mm Promus Premier DES in the second OM branch of  the Circumflex. I elected not to post-dilate the stent given the appearance after deployment (good size for the vessel with good step-down). The stenosis was taken from 99% down to 0%.   There were no immediate complications. The patient was taken to the recovery area in stable condition.   Hemodynamic Findings: Central aortic pressure: 155/91  Impression: 1. Unstable angina with severe double vessel CAD 2. Severe stenosis proximal LAD, now s/p successful PTCA/DES x 1 proximal LAD 3. Severe stenosis proximal OM2, now s/p successful PTCA/DES x 1 OM2  Recommendations: Continue ASA and Plavix for one year. Continue statin. No beta blocker with bradycardia.        Complications:  None; patient tolerated the procedure well.

## 2013-07-17 ENCOUNTER — Encounter (HOSPITAL_COMMUNITY): Payer: Self-pay | Admitting: Nurse Practitioner

## 2013-07-17 DIAGNOSIS — I2510 Atherosclerotic heart disease of native coronary artery without angina pectoris: Secondary | ICD-10-CM | POA: Diagnosis present

## 2013-07-17 DIAGNOSIS — I200 Unstable angina: Secondary | ICD-10-CM

## 2013-07-17 DIAGNOSIS — E785 Hyperlipidemia, unspecified: Secondary | ICD-10-CM | POA: Diagnosis present

## 2013-07-17 LAB — BASIC METABOLIC PANEL
CO2: 21 mEq/L (ref 19–32)
Calcium: 8.4 mg/dL (ref 8.4–10.5)
Creatinine, Ser: 1.18 mg/dL (ref 0.50–1.35)
GFR calc non Af Amer: 64 mL/min — ABNORMAL LOW (ref >90)
Glucose, Bld: 99 mg/dL (ref 70–99)
Potassium: 4.2 mEq/L (ref 3.5–5.1)
Sodium: 139 mEq/L (ref 135–145)

## 2013-07-17 LAB — CBC
Hemoglobin: 13.7 g/dL (ref 13.0–17.0)
MCH: 32.4 pg (ref 26.0–34.0)
MCHC: 34.7 g/dL (ref 30.0–36.0)
MCV: 93.4 fL (ref 78.0–100.0)
RBC: 4.23 MIL/uL (ref 4.22–5.81)

## 2013-07-17 MED ORDER — CLOPIDOGREL BISULFATE 75 MG PO TABS: 75.0000 mg | ORAL_TABLET | Freq: Every day | ORAL | Status: DC

## 2013-07-17 MED ORDER — ATORVASTATIN CALCIUM 80 MG PO TABS: 80.0000 mg | ORAL_TABLET | Freq: Every day | ORAL | Status: DC

## 2013-07-17 MED ORDER — NITROGLYCERIN 0.4 MG SL SUBL: 0.4000 mg | SUBLINGUAL_TABLET | SUBLINGUAL | Status: AC | PRN

## 2013-07-17 MED ORDER — ACTIVE PARTNERSHIP FOR HEALTH OF YOUR HEART BOOK
Freq: Once | Status: AC
  Administered 2013-07-17: 04:00:00
  Filled 2013-07-17: qty 1

## 2013-07-17 MED FILL — Sodium Chloride IV Soln 0.9%: INTRAVENOUS | Qty: 50 | Status: AC

## 2013-07-17 NOTE — Progress Notes (Signed)
Subjective:  Feels well after PCI yesterday. No chest pain.  Objective:  Vital Signs in the last 24 hours: Temp:  [97.6 F (36.4 C)-97.8 F (36.6 C)] 97.6 F (36.4 C) (11/14 0752) Pulse Rate:  [45-59] 55 (11/14 0752) Resp:  [16-20] 18 (11/14 0752) BP: (105-153)/(55-97) 118/67 mmHg (11/14 0752) SpO2:  [96 %-99 %] 99 % (11/14 0752) Weight:  [207 lb 0.2 oz (93.9 kg)] 207 lb 0.2 oz (93.9 kg) (11/14 0531)  Intake/Output from previous day: 11/13 0701 - 11/14 0700 In: 1110 [P.O.:960; I.V.:150] Out: -  Intake/Output from this shift:    . aspirin EC  81 mg Oral Daily  . atorvastatin  10 mg Oral q1800  . clopidogrel  75 mg Oral Q breakfast      Physical Exam: The patient appears to be in no distress.  Head and neck exam reveals that the pupils are equal and reactive.  The extraocular movements are full.  There is no scleral icterus.  Mouth and pharynx are benign.  No lymphadenopathy.  No carotid bruits.  The jugular venous pressure is normal.  Thyroid is not enlarged or tender.  Chest is clear to percussion and auscultation.  No rales or rhonchi.  Expansion of the chest is symmetrical.  Heart reveals no abnormal lift or heave.  First and second heart sounds are normal.  There is no murmur gallop rub or click.  The abdomen is soft and nontender.  Bowel sounds are normoactive.  There is no hepatosplenomegaly or mass.  There are no abdominal bruits.  Extremities reveal no phlebitis or edema.  Pedal pulses are good.  There is no cyanosis or clubbing. Good radial pulse.  Neurologic exam is normal strength and no lateralizing weakness.  No sensory deficits.  Integument reveals no rash  Lab Results:  Recent Labs  07/15/13 1532 07/17/13 0505  WBC 3.2* 3.6*  HGB 14.7 13.7  PLT 124* 120*    Recent Labs  07/15/13 1145 07/15/13 1532 07/17/13 0505  NA 137  --  139  K 4.1  --  4.2  CL 102  --  105  CO2 22  --  21  GLUCOSE 89  --  99  BUN 14  --  10  CREATININE 1.17  1.25 1.18    Recent Labs  07/15/13 1532 07/16/13 0230  TROPONINI <0.30 <0.30   Hepatic Function Panel No results found for this basename: PROT, ALBUMIN, AST, ALT, ALKPHOS, BILITOT, BILIDIR, IBILI,  in the last 72 hours  Recent Labs  07/16/13 0230  CHOL 261*   No results found for this basename: PROTIME,  in the last 72 hours  Imaging: Dg Chest 2 View  07/15/2013   CLINICAL DATA:  Chest pain and dyspnea  EXAM: CHEST  2 VIEW  COMPARISON:  None.  FINDINGS: The lungs are borderline hypoinflated. There is no focal infiltrate. The cardiac silhouette is top-normal in size. The pulmonary vascularity is not engorged. There is no pleural effusion or pneumothorax. There is calcification of the anterior longitudinal ligament of the thoracic spine.  IMPRESSION: There is no evidence of pneumonia or CHF. The lungs appear mildly hypoinflated.   Electronically Signed   By: David  [place]   On: 07/15/2013 13:07    Cardiac Studies: Telemetry shows sinus bradycardia. Assessment/Plan:  1. Unstable angina with severe double vessel CAD  2. Severe stenosis proximal LAD, now s/p successful PTCA/DES x 1 proximal LAD  3. Severe stenosis proximal OM2, now s/p successful PTCA/DES x  1 OM2  Okay for discharge today.Continue ASA and Plavix for one year. Continue statin. No beta blocker with bradycardia.    LOS: 2 days    Cassell Clement 07/17/2013, 8:39 AM

## 2013-07-17 NOTE — Discharge Summary (Signed)
Discharge Summary   Patient ID: Adrian [name],  MRN: [id number], DOB/AGE: Mar 14, 1951 62 y.o.  Admit date: 07/15/2013 Discharge date: 07/17/2013  Primary Care Provider: No PCP Per Patient Primary Cardiologist: P. Eden Emms, MD   Discharge Diagnoses Principal Problem:   Unstable angina  **s/p successful PCI and drug-eluting stent placement to the LAD and second obtuse marginal this admission.   Active Problems:   Coronary artery disease   Hyperlipidemia  **LDL 167.  Allergies No Known Allergies  Procedures  Cardiac Catheterization and Percutaneous Coronary Intervention 11.13.2014  Findings:  Ao Pressure: 130/76/98 LV Pressure: 136/15/19 There was no signficant gradient across the aortic valve on pullback.  Left main: Normal LAD: Long vessel courses to apex. 80% proximal lesion. Diffuse 30% plaque through midvessel with focal intramyocardial segment. Distal vessel with moderate diffuse disease. D1 small 95% ostial. Diffuse disease. D2,D3,D4 small without high-grad stenosis.    **The LAD was successfully stented using a 3.0 x 18 mm Xience drug-eluting stent.**  LCX: Moderate to large sized co-dominant vessel. AV groove 30-40% lesion in midsection .OM-1 long moderate-sized vessel.with long 50-60% lesion proximally in stent. OM-2 long moderate-sized vessel 99% ostial lesion followed by long 80-90% diffusely diseased segment in proximal to mid section. Moderate plaque in distal vessel. OM-3 small 60% ostial. PL and PDA very small.    **The second obtuse marginal was successfully stented using a 2.25 x 32 mm Promus Premier drug-eluting stent.**  RCA: Small co-dominant vessel. 30% lesion prior to PDA. Mild disease distally LV-gram done in the RAO projection: Ejection fraction = 45-50% with HK basal to mid inferior wall _____________   History of Present Illness  62 year old male with a prior history of CAD status post prior stenting of the first obtuse marginal in 2008. He was  in his usual state of health until approximately 2 weeks prior to admission when he began to experience exertional chest tightness associated with dyspnea. Symptoms began to occur with less and less activity and during sexual intercourse the evening prior to admission which prompted him to present to the ED on November 12. There, cardiac enzymes were negative and ECG was nonacute. He was admitted for further evaluation.  Hospital Course  Patient ruled out for myocardial infarction. He had no further chest pain. Given concern for unstable angina, decision was made to pursue diagnostic cardiac catheterization and this took place on November 13, revealing severe stenosis within the LAD and also the second obtuse marginal. Both areas were successfully treated with drug-eluting stents as outlined above and post procedure Adrian [name] has had no further chest discomfort. He has been ambulating without difficulty and will be discharged home today in good condition. We have initiated aspirin, Plavix, and high potency statin therapy. In the setting of negative cardiac markers and baseline bradycardia, we have not started beta blocker therapy.  Discharge Vitals Blood pressure 118/67, pulse 55, temperature 97.6 F (36.4 C), temperature source Oral, resp. rate 18, height 5\' 10"  (1.778 m), weight 207 lb 0.2 oz (93.9 kg), SpO2 99.00%.  Filed Weights   07/15/13 1700 07/16/13 0030 07/17/13 0531  Weight: 205 lb (92.987 kg) 208 lb 3.2 oz (94.439 kg) 207 lb 0.2 oz (93.9 kg)   Labs  CBC  Recent Labs  07/15/13 1532 07/17/13 0505  WBC 3.2* 3.6*  HGB 14.7 13.7  HCT 42.4 39.5  MCV 93.4 93.4  PLT 124* 120*   Basic Metabolic Panel  Recent Labs  07/15/13 1145 07/15/13 1532 07/17/13 0505  NA 137  --  139  K 4.1  --  4.2  CL 102  --  105  CO2 22  --  21  GLUCOSE 89  --  99  BUN 14  --  10  CREATININE 1.17 1.25 1.18  CALCIUM 9.3  --  8.4   Cardiac Enzymes  Recent Labs  07/15/13 0315 07/15/13 1532  07/16/13 0230  TROPONINI <0.30 <0.30 <0.30   Fasting Lipid Panel  Recent Labs  07/16/13 0230  CHOL 261*  HDL 72  LDLCALC 167*  TRIG 111  CHOLHDL 3.6   Disposition  Pt is being discharged home today in good condition.  Follow-up Plans & Appointments  Follow-up Information   Follow up with Tereso Newcomer, PA-C On 07/24/2013. (8:50 AM)    Specialty:  Physician Assistant   Contact information:   [address] [place] [address] [phone]      Discharge Medications    Medication List    STOP taking these medications       indomethacin 50 MG capsule  Commonly known as:  INDOCIN      TAKE these medications       aspirin EC 81 MG tablet  Take 81 mg by mouth every morning.     atorvastatin 80 MG tablet  Commonly known as:  LIPITOR  Take 1 tablet (80 mg total) by mouth daily at 6 PM.     clopidogrel 75 MG tablet  Commonly known as:  PLAVIX  Take 1 tablet (75 mg total) by mouth daily with breakfast.     colchicine 0.6 MG tablet  Take 0.6 mg by mouth 3 (three) times daily as needed. For gout     nitroGLYCERIN 0.4 MG SL tablet  Commonly known as:  NITROSTAT  Place 1 tablet (0.4 mg total) under the tongue every 5 (five) minutes x 3 doses as needed for chest pain.        Outstanding Labs/Studies  Follow-up lipids and LFTs in 6-8 weeks.  Duration of Discharge Encounter   Greater than 30 minutes including physician time.  Signed, Nicolasa Ducking NP 07/17/2013, 9:08 AM

## 2013-07-17 NOTE — Progress Notes (Signed)
CARDIAC REHAB PHASE I   PRE:  Rate/Rhythm: 72 SR    BP: sitting 118/67    SaO2:   MODE:  Ambulation: 650 ft   POST:  Rate/Rhythm: 74 SR    BP: sitting 128/71     SaO2:   Tolerated well, feels good. Ed completed with friend present. Interested in CRPII and will send referral to [facility]. Pt likes to walk/jog. Encouraged pt to start CRPII before getting back to jogging.  [phone]   Elissa Lovett [place] CES, ACSM 07/17/2013 9:23 AM

## 2013-07-17 NOTE — Progress Notes (Signed)
Patient Name: Adrian [name] Date of Encounter: 07/17/2013   Principal Problem:   Unstable angina Active Problems:   Coronary artery disease   Hyperlipidemia   SUBJECTIVE  No chest pain or sob.  Eager to go home.  CURRENT MEDS . aspirin EC  81 mg Oral Daily  . atorvastatin  10 mg Oral q1800  . clopidogrel  75 mg Oral Q breakfast    OBJECTIVE  Filed Vitals:   07/16/13 1951 07/17/13 0027 07/17/13 0531 07/17/13 0534  BP: 111/66 105/55 143/97 143/97  Pulse: 56 59 58   Temp: 97.8 F (36.6 C) 97.8 F (36.6 C) 97.6 F (36.4 C)   TempSrc: Oral Oral Oral   Resp: 18 16 20    Height:      Weight:   207 lb 0.2 oz (93.9 kg)   SpO2: 98% 96% 99%     Intake/Output Summary (Last 24 hours) at 07/17/13 0749 Last data filed at 07/16/13 2300  Gross per 24 hour  Intake   1110 ml  Output      0 ml  Net   1110 ml   Filed Weights   07/15/13 1700 07/16/13 0030 07/17/13 0531  Weight: 205 lb (92.987 kg) 208 lb 3.2 oz (94.439 kg) 207 lb 0.2 oz (93.9 kg)    PHYSICAL EXAM  General: Pleasant, NAD. Neuro: Alert and oriented X 3. Moves all extremities spontaneously. Psych: Normal affect. HEENT:  Normal  Neck: Supple without bruits or JVD. Lungs:  Resp regular and unlabored, CTA. Heart: RRR no s3, s4, or murmurs. Abdomen: Soft, non-tender, non-distended, BS + x 4.  Extremities: No clubbing, cyanosis or edema. DP/PT/Radials 2+ and equal bilaterally.  R radial cath site w/o bleeding/bruit/hematoma.  Accessory Clinical Findings  CBC  Recent Labs  07/15/13 1532 07/17/13 0505  WBC 3.2* 3.6*  HGB 14.7 13.7  HCT 42.4 39.5  MCV 93.4 93.4  PLT 124* 120*   Basic Metabolic Panel  Recent Labs  07/15/13 1145 07/15/13 1532 07/17/13 0505  NA 137  --  139  K 4.1  --  4.2  CL 102  --  105  CO2 22  --  21  GLUCOSE 89  --  99  BUN 14  --  10  CREATININE 1.17 1.25 1.18  CALCIUM 9.3  --  8.4   Cardiac Enzymes  Recent Labs  07/15/13 0315 07/15/13 1532 07/16/13 0230    TROPONINI <0.30 <0.30 <0.30   Fasting Lipid Panel  Recent Labs  07/16/13 0230  CHOL 261*  HDL 72  LDLCALC 167*  TRIG 111  CHOLHDL 3.6   TELE  Sinus brady.  ECG  Sinus brady, 45 (post pci).  Radiology/Studies  Dg Chest 2 View  07/15/2013   CLINICAL DATA:  Chest pain and dyspnea  EXAM: CHEST  2 VIEW  COMPARISON:  None.  FINDINGS: The lungs are borderline hypoinflated. There is no focal infiltrate. The cardiac silhouette is top-normal in size. The pulmonary vascularity is not engorged. There is no pleural effusion or pneumothorax. There is calcification of the anterior longitudinal ligament of the thoracic spine.  IMPRESSION: There is no evidence of pneumonia or CHF. The lungs appear mildly hypoinflated.   Electronically Signed   By: David  [place]   On: 07/15/2013 13:07   ASSESSMENT AND PLAN  1.  [place]/CAD:  S/p PCI/DES to the LAD and OM2 yesterday.  No chest pain overnight.  No wrist complaints.  Feels good.  Ambulating w/o difficulty.  Questions answered.  Plan d/c  today on asa, plavix, statin (increase - LDL 167), prn ntg.  No bb 2/2 neg CE/baseline bradycardia.  F/u in office in 2 wks.  2.  HL: LDL 167.  Increase lipitor to 80.  3.  ETOH Abuse:  Drinks several glasses a wine each night.  Cessation advised.  Signed, Nicolasa Ducking NP

## 2013-07-24 ENCOUNTER — Ambulatory Visit (INDEPENDENT_AMBULATORY_CARE_PROVIDER_SITE_OTHER): Payer: BC Managed Care – PPO | Admitting: Physician Assistant

## 2013-07-24 ENCOUNTER — Encounter: Payer: Self-pay | Admitting: Physician Assistant

## 2013-07-24 VITALS — BP 124/88 | HR 74 | Ht 70.0 in | Wt 204.0 lb

## 2013-07-24 DIAGNOSIS — I2510 Atherosclerotic heart disease of native coronary artery without angina pectoris: Secondary | ICD-10-CM

## 2013-07-24 DIAGNOSIS — E785 Hyperlipidemia, unspecified: Secondary | ICD-10-CM

## 2013-07-24 MED ORDER — ASPIRIN EC 81 MG PO TBEC: 81.0000 mg | DELAYED_RELEASE_TABLET | Freq: Every day | ORAL | Status: AC

## 2013-07-24 NOTE — Patient Instructions (Signed)
RE-START ASPIRIN AND PLAVIX; DO NOT STOP THESE MEDICATIONS UNLESS ADVISED BY YOUR DOCTOR  FASTING LIPID AND LIVER PANEL TO BE DONE ON 09/11/13 @ 9:45  YOU HAVE A FOLLOW UP APPT WITH DR. Eden Emms 09/23/13 @ 12PM  You have been referred to [facility] FOR CARDIAC REHAB; DX CAD, HLD

## 2013-07-24 NOTE — Progress Notes (Signed)
[address] [place], [place]  [address] Phone: [phone] Fax:  [phone]  Date:  07/24/2013   ID:  Adrian [name], DOB 1951-07-14, MRN [id number]  PCP:  No PCP Per Patient  Cardiologist:  Dr. Charlton Haws     History of Present Illness: Adrian [name] is a 62 y.o. male with a hx of CAD, s/p PCI ot OM1 in 2008 ([place], [place]), HL, gout.  He was admitted to the hospital 11/12-11/14 with unstable angina.  He presented with progressively worsening chest pain. Cardiac markers remained negative. LHC (07/16/13): Proximal LAD 80%, mid LAD 30%, small B1 ostial 95%, mid AV groove circumflex 30-40%, proximal OM1 50-60% ISR, ostial OM 299% and then 80-90%, small OM 360% ostial, RCA 30%, EF 45-50% with inferior HK. PCI: Xience DES (3 x 18 mm) DES to the proximal LAD; Promus premier (2.25 x 32 mm) DES to the oOM2.  He was not placed on beta blocker due to bradycardia.  He is doing well.  The patient denies chest pain, shortness of breath, syncope, orthopnea, PND or significant pedal edema.   Recent Labs: 07/15/2013: Pro B Natriuretic peptide (BNP) 53.1  07/16/2013: HDL 72; LDL (calc) 167*  07/17/2013: Creatinine 1.18; Hemoglobin 13.7; Potassium 4.2   Wt Readings from Last 3 Encounters:  07/24/13 204 lb (92.534 kg)  07/17/13 207 lb 0.2 oz (93.9 kg)  07/17/13 207 lb 0.2 oz (93.9 kg)     Past Medical History  Diagnosis Date  . Coronary artery disease     a. 2008 s/p MI [facility], [place]) with stenting of the OM1;  b. 07/2013 Cath/PCI: LM nl, LAD 80p (3.0x18 Xience DES), 32m, D1 95ost, LCX 30-68m, OM1 50-60p ISR, OM2 99ost, 80-90p/m (2.25x32 Promus DES), OM3 60ost, RCA 30ost, EF 45-50 basal-mid inf HK.  . Gout   . Hyperlipidemia     Current Outpatient Prescriptions  Medication Sig Dispense Refill  . atorvastatin (LIPITOR) 80 MG tablet Take 1 tablet (80 mg total) by mouth daily at 6 PM.  30 tablet  6  . clopidogrel (PLAVIX) 75 MG tablet Take 1 tablet (75 mg total) by mouth daily with  breakfast.  30 tablet  6  . nitroGLYCERIN (NITROSTAT) 0.4 MG SL tablet Place 1 tablet (0.4 mg total) under the tongue every 5 (five) minutes x 3 doses as needed for chest pain.  25 tablet  3  . sildenafil (VIAGRA) 50 MG tablet Take 50 mg by mouth daily as needed for erectile dysfunction.       No current facility-administered medications for this visit.    Allergies:   Review of patient's allergies indicates no known allergies.   Social History:  The patient  reports that he has never smoked. He has never used smokeless tobacco. He reports that he drinks about 1.2 ounces of alcohol per week. He reports that he does not use illicit drugs.   Family History:  The patient's family history includes Coronary artery disease in his mother; Heart attack in his father.   ROS:  Please see the history of present illness.      All other systems reviewed and negative.   PHYSICAL EXAM: VS:  BP 124/88  Pulse 74  Ht 5\' 10"  (1.778 m)  Wt 204 lb (92.534 kg)  BMI 29.27 kg/m2 Well nourished, well developed, in no acute distress HEENT: normal Neck: no JVD Cardiac:  normal S1, S2; RRR; no murmur Lungs:  clear to auscultation bilaterally, no wheezing, rhonchi or rales Abd: soft,  nontender, no hepatomegaly Ext: no edema; right wrist without hematoma or mass  Skin: warm and dry Neuro:  CNs 2-12 intact, no focal abnormalities noted  EKG:  NSR, HR 74, normal axis, no ST changes     ASSESSMENT AND PLAN:  1. CAD:  Doing well after 2v PCI for [place].  We discussed the importance of dual antiplatelet therapy. He is not taking ASA.  I will give him samples of ASA today.  We discussed the importance of taking both of these medications.  Refer to cardiac rehab. 2. Hyperlipidemia:  Continue statin.  Check Lipids and LFTs in 6 weeks.   3. Elevated BP:  BP is borderline.  Continue to monitor.  We discussed limiting his salt. 4. Erectile Dysfunction:  We discussed not using NTG if he has taken Viagra. 5. Disposition:   F/u with Dr. Charlton Haws in 8 weeks.   Signed, Tereso Newcomer, PA-C  07/24/2013 9:20 AM

## 2013-08-18 ENCOUNTER — Telehealth (HOSPITAL_COMMUNITY): Payer: Self-pay | Admitting: Cardiac Rehabilitation

## 2013-08-18 NOTE — Telephone Encounter (Signed)
Attempted to contact pt to enroll in cardiac rehab program. Multiple attempts with no response from phone message or letter mailed.

## 2013-09-11 ENCOUNTER — Other Ambulatory Visit (INDEPENDENT_AMBULATORY_CARE_PROVIDER_SITE_OTHER): Payer: BC Managed Care – PPO

## 2013-09-11 DIAGNOSIS — E785 Hyperlipidemia, unspecified: Secondary | ICD-10-CM

## 2013-09-11 LAB — HEPATIC FUNCTION PANEL
ALBUMIN: 3.9 g/dL (ref 3.5–5.2)
ALK PHOS: 106 U/L (ref 39–117)
ALT: 35 U/L (ref 0–53)
AST: 28 U/L (ref 0–37)
Bilirubin, Direct: 0 mg/dL (ref 0.0–0.3)
Total Bilirubin: 0.7 mg/dL (ref 0.3–1.2)
Total Protein: 7.5 g/dL (ref 6.0–8.3)

## 2013-09-11 LAB — LIPID PANEL
CHOL/HDL RATIO: 3
CHOLESTEROL: 220 mg/dL — AB (ref 0–200)
HDL: 75.7 mg/dL (ref >39.00)
Triglycerides: 74 mg/dL (ref 0.0–149.0)
VLDL: 14.8 mg/dL (ref 0.0–40.0)

## 2013-09-11 LAB — LDL CHOLESTEROL, DIRECT: Direct LDL: 118.1 mg/dL

## 2013-09-14 ENCOUNTER — Telehealth: Payer: Self-pay | Admitting: *Deleted

## 2013-09-14 DIAGNOSIS — E785 Hyperlipidemia, unspecified: Secondary | ICD-10-CM

## 2013-09-14 MED ORDER — ROSUVASTATIN CALCIUM 40 MG PO TABS: 40.0000 mg | ORAL_TABLET | Freq: Every day | ORAL | Status: DC

## 2013-09-14 NOTE — Telephone Encounter (Signed)
pt notified about lab results and to d/c lipitor and start crestor 40 mg daily, pt states he will pick and start 09/15/13, FLP/LFT 11/17/13. Pt reminded to keep his 09/23/13 appt w/Dr. Johnsie Cancel.

## 2013-09-16 ENCOUNTER — Telehealth: Payer: Self-pay

## 2013-09-16 NOTE — Telephone Encounter (Signed)
Patient came into office for samples of crestor I gave the samples to him up front

## 2013-09-23 ENCOUNTER — Ambulatory Visit: Payer: BC Managed Care – PPO | Admitting: Cardiovascular Disease

## 2013-09-29 ENCOUNTER — Encounter: Payer: Self-pay | Admitting: Cardiovascular Disease

## 2013-09-29 ENCOUNTER — Ambulatory Visit (INDEPENDENT_AMBULATORY_CARE_PROVIDER_SITE_OTHER): Payer: BC Managed Care – PPO | Admitting: Cardiovascular Disease

## 2013-09-29 VITALS — BP 123/82 | HR 70 | Ht 70.0 in | Wt 204.0 lb

## 2013-09-29 DIAGNOSIS — I2510 Atherosclerotic heart disease of native coronary artery without angina pectoris: Secondary | ICD-10-CM

## 2013-09-29 DIAGNOSIS — E785 Hyperlipidemia, unspecified: Secondary | ICD-10-CM

## 2013-09-29 NOTE — Assessment & Plan Note (Addendum)
Cholesterol is at goal.  Continue current dose of statin and diet Rx.  No myalgias or side effects.  F/U  LFT's in 6 months. Lab Results  Component Value Date   LDLCALC 167* 07/16/2013  Statin changed to high dose crestor in November needs f/u labs with [place] [facility]

## 2013-09-29 NOTE — Progress Notes (Signed)
Patient ID: Adrian [name], male   DOB: 07-May-1951, 63 y.o.   MRN: [id number] Adrian [name] is a 63 y.o. male with a hx of CAD, s/p PCI ot OM1 in 2008 ([place], [place]), HL, gout. He was admitted to the hospital 11/12-11/14 with unstable angina. He presented with progressively worsening chest pain. Cardiac markers remained negative. LHC (07/16/13): Proximal LAD 80%, mid LAD 30%, small B1 ostial 95%, mid AV groove circumflex 30-40%, proximal OM1 50-60% ISR, ostial OM 299% and then 80-90%, small OM 360% ostial, RCA 30%, EF 45-50% with inferior HK. PCI: Xience DES (3 x 18 mm) DES to the proximal LAD; Promus premier (2.25 x 32 mm) DES to the oOM2. He was not placed on beta blocker due to bradycardia.   He is doing well. The patient denies chest pain, shortness of breath, syncope, orthopnea, PND or significant pedal edema.   Discussed not using Viagra given his CAD    ROS: Denies fever, malais, weight loss, blurry vision, decreased visual acuity, cough, sputum, SOB, hemoptysis, pleuritic pain, palpitaitons, heartburn, abdominal pain, melena, lower extremity edema, claudication, or rash.  All other systems reviewed and negative  General: Affect appropriate Healthy:  appears stated age 76: normal Neck supple with no adenopathy JVP normal no bruits no thyromegaly Lungs clear with no wheezing and good diaphragmatic motion Heart:  S1/S2 no murmur, no rub, gallop or click PMI normal Abdomen: benighn, BS positve, no tenderness, no AAA no bruit.  No HSM or HJR Distal pulses intact with no bruits No edema Neuro non-focal Skin warm and dry No muscular weakness   Current Outpatient Prescriptions  Medication Sig Dispense Refill  . aspirin EC 81 MG tablet Take 1 tablet (81 mg total) by mouth daily.      . clopidogrel (PLAVIX) 75 MG tablet Take 1 tablet (75 mg total) by mouth daily with breakfast.  30 tablet  6  . nitroGLYCERIN (NITROSTAT) 0.4 MG SL tablet Place 1 tablet (0.4 mg total) under the tongue every  5 (five) minutes x 3 doses as needed for chest pain.  25 tablet  3  . rosuvastatin (CRESTOR) 40 MG tablet Take 1 tablet (40 mg total) by mouth daily.  30 tablet  11  . sildenafil (VIAGRA) 50 MG tablet Take 50 mg by mouth daily as needed for erectile dysfunction.       No current facility-administered medications for this visit.    Allergies  Review of patient's allergies indicates no known allergies.  Electrocardiogram:  07/24/13  SR rate 71 normal ECG   Assessment and Plan

## 2013-09-29 NOTE — Patient Instructions (Signed)
Your physician wants you to follow-up in: 6 months with Dr. Nishan. You will receive a reminder letter in the mail two months in advance. If you don't receive a letter, please call our office to schedule the follow-up appointment.  Your physician recommends that you continue on your current medications as directed. Please refer to the Current Medication list given to you today.  

## 2013-09-29 NOTE — Assessment & Plan Note (Signed)
Stable with no angina and good activity level.  Continue medical Rx Recent stent to LAD 11/14  DAT for a year

## 2013-11-17 ENCOUNTER — Other Ambulatory Visit: Payer: BC Managed Care – PPO

## 2014-02-12 ENCOUNTER — Telehealth: Payer: Self-pay | Admitting: *Deleted

## 2014-02-12 NOTE — Telephone Encounter (Signed)
Patient called for crestor samples. I will place at the front desk for pick up.

## 2014-02-16 ENCOUNTER — Telehealth: Payer: Self-pay | Admitting: Cardiovascular Disease

## 2014-02-16 NOTE — Telephone Encounter (Signed)
New Message:  Pt is c/o SOB and tightness in his chest... States he isn't having these symptoms now but did last night.

## 2014-02-16 NOTE — Telephone Encounter (Signed)
Received call directly from operator.  Patient c/o intermittent symptoms over the last couple of weeks of midsternal chest pain/tightness worse with exertion.  Patient then develops SOB which he states is resolved after resting for a while.  Patient states he took his first NTG last night which relieved his pain.  Patient states he has never taken NTG before.  Patient has hx CAD, states he has stents in his heart.  Patient denies nausea, diaphoresis.  Patient states he feels well currently; states his daughter advised him to call our office for advice.   I advised patient that his symptoms are concerning for cardiac chest pain and advised patient to go to the ER.  Patient state he is in [place] helping care for his grandson and he will go to the hospital there.  Patient verbalized understanding and agreement.  I advised patient to call our office for f/u based on findings today. Patient reports he has an appointment in July.

## 2014-03-15 ENCOUNTER — Ambulatory Visit: Payer: BC Managed Care – PPO | Admitting: Physician Assistant

## 2014-04-05 ENCOUNTER — Ambulatory Visit: Payer: BC Managed Care – PPO | Admitting: Physician Assistant

## 2014-08-12 ENCOUNTER — Encounter (HOSPITAL_COMMUNITY): Payer: Self-pay | Admitting: Internal Medicine

## 2015-01-16 ENCOUNTER — Emergency Department (HOSPITAL_COMMUNITY): Payer: Self-pay

## 2015-01-16 ENCOUNTER — Emergency Department (HOSPITAL_COMMUNITY)
Admission: EM | Admit: 2015-01-16 | Discharge: 2015-01-17 | Disposition: A | Discharge status: Home / Self Care | Payer: Self-pay | Attending: Emergency Medicine | Admitting: Emergency Medicine

## 2015-01-16 ENCOUNTER — Encounter (HOSPITAL_COMMUNITY): Payer: Self-pay | Admitting: Emergency Medicine

## 2015-01-16 DIAGNOSIS — S300XXA Contusion of lower back and pelvis, initial encounter: Secondary | ICD-10-CM | POA: Insufficient documentation

## 2015-01-16 DIAGNOSIS — Y9389 Activity, other specified: Secondary | ICD-10-CM | POA: Insufficient documentation

## 2015-01-16 DIAGNOSIS — I2510 Atherosclerotic heart disease of native coronary artery without angina pectoris: Secondary | ICD-10-CM | POA: Insufficient documentation

## 2015-01-16 DIAGNOSIS — S5002XA Contusion of left elbow, initial encounter: Secondary | ICD-10-CM | POA: Insufficient documentation

## 2015-01-16 DIAGNOSIS — Z7982 Long term (current) use of aspirin: Secondary | ICD-10-CM | POA: Insufficient documentation

## 2015-01-16 DIAGNOSIS — W19XXXA Unspecified fall, initial encounter: Secondary | ICD-10-CM

## 2015-01-16 DIAGNOSIS — Y92009 Unspecified place in unspecified non-institutional (private) residence as the place of occurrence of the external cause: Secondary | ICD-10-CM | POA: Insufficient documentation

## 2015-01-16 DIAGNOSIS — Z7902 Long term (current) use of antithrombotics/antiplatelets: Secondary | ICD-10-CM | POA: Insufficient documentation

## 2015-01-16 DIAGNOSIS — W109XXA Fall (on) (from) unspecified stairs and steps, initial encounter: Secondary | ICD-10-CM | POA: Insufficient documentation

## 2015-01-16 DIAGNOSIS — Z79899 Other long term (current) drug therapy: Secondary | ICD-10-CM | POA: Insufficient documentation

## 2015-01-16 DIAGNOSIS — E785 Hyperlipidemia, unspecified: Secondary | ICD-10-CM | POA: Insufficient documentation

## 2015-01-16 DIAGNOSIS — Z8739 Personal history of other diseases of the musculoskeletal system and connective tissue: Secondary | ICD-10-CM | POA: Insufficient documentation

## 2015-01-16 DIAGNOSIS — Y998 Other external cause status: Secondary | ICD-10-CM | POA: Insufficient documentation

## 2015-01-16 LAB — CBC WITH DIFFERENTIAL/PLATELET
BASOS PCT: 0 % (ref 0–1)
Basophils Absolute: 0 10*3/uL (ref 0.0–0.1)
Eosinophils Absolute: 0 10*3/uL (ref 0.0–0.7)
Eosinophils Relative: 1 % (ref 0–5)
HCT: 38.3 % — ABNORMAL LOW (ref 39.0–52.0)
HEMOGLOBIN: 13 g/dL (ref 13.0–17.0)
Lymphocytes Relative: 31 % (ref 12–46)
Lymphs Abs: 1.4 10*3/uL (ref 0.7–4.0)
MCH: 31.8 pg (ref 26.0–34.0)
MCHC: 33.9 g/dL (ref 30.0–36.0)
MCV: 93.6 fL (ref 78.0–100.0)
MONO ABS: 0.7 10*3/uL (ref 0.1–1.0)
Monocytes Relative: 15 % — ABNORMAL HIGH (ref 3–12)
NEUTROS ABS: 2.5 10*3/uL (ref 1.7–7.7)
Neutrophils Relative %: 53 % (ref 43–77)
Platelets: 167 10*3/uL (ref 150–400)
RBC: 4.09 MIL/uL — ABNORMAL LOW (ref 4.22–5.81)
RDW: 13.2 % (ref 11.5–15.5)
WBC: 4.6 10*3/uL (ref 4.0–10.5)

## 2015-01-16 LAB — I-STAT CHEM 8, ED
BUN: 12 mg/dL (ref 6–20)
CALCIUM ION: 1.08 mmol/L — AB (ref 1.13–1.30)
CREATININE: 1.7 mg/dL — AB (ref 0.61–1.24)
Chloride: 103 mmol/L (ref 101–111)
Glucose, Bld: 108 mg/dL — ABNORMAL HIGH (ref 65–99)
HCT: 37 % — ABNORMAL LOW (ref 39.0–52.0)
Hemoglobin: 12.6 g/dL — ABNORMAL LOW (ref 13.0–17.0)
POTASSIUM: 3.8 mmol/L (ref 3.5–5.1)
Sodium: 139 mmol/L (ref 135–145)
TCO2: 19 mmol/L (ref 0–100)

## 2015-01-16 NOTE — ED Notes (Signed)
Patient states that he fell down stairs on Friday.  He fell down three or four.  Patient is on blood thinners and has a large bruise on his back in the lumbar region.  He states that he feels better than he did on Friday.  Just wants to get the bruise checked out.

## 2015-01-16 NOTE — ED Provider Notes (Signed)
CSN: [id number]     Arrival date & time 01/16/15  2042 History   First MD Initiated Contact with Patient 01/16/15 2221     Chief Complaint  Patient presents with  . Fall     (Consider location/radiation/quality/duration/timing/severity/associated sxs/prior Treatment) Patient is a 64 y.o. male presenting with fall.  Fall Pertinent negatives include no chest pain, no abdominal pain and no shortness of breath.   patient had a fall on the stairs 2 days ago. States he had been out to the club and had a couple drinks came home and wanted to move some furniture. He states events fell on the stairs. States he hit his back. He is on Plavix and has bruising there. States that he is worried because of all the bruising and this wants to get checked out. States that the pain has improved and the swelling is less severe than it was 2 days ago.  Past Medical History  Diagnosis Date  . Coronary artery disease     a. 2008 s/p MI [facility], [place]) with stenting of the OM1;  b. 07/2013 Cath/PCI: LM nl, LAD 80p (3.0x18 Xience DES), 39m, D1 95ost, LCX 30-53m, OM1 50-60p ISR, OM2 99ost, 80-90p/m (2.25x32 Promus DES), OM3 60ost, RCA 30ost, EF 45-50 basal-mid inf HK.  . Gout   . Hyperlipidemia    Past Surgical History  Procedure Laterality Date  . Coronary angioplasty with stent placement  2008    [place], 1 stent, possibly to the LAD  . Cataract extraction w/ intraocular lens  implant, bilateral Bilateral 2007  . Tonsillectomy  ~ 1960  . Left heart catheterization with coronary angiogram N/A 07/16/2013    Procedure: LEFT HEART CATHETERIZATION WITH CORONARY ANGIOGRAM;  Surgeon: Jolaine Artist, MD;  Location: [facility] CATH LAB;  Service: Cardiovascular;  Laterality: N/A;   Family History  Problem Relation Age of Onset  . Heart attack Father   . Coronary artery disease Mother    History  Substance Use Topics  . Smoking status: Never Smoker   . Smokeless tobacco: Never Used  . Alcohol  Use: 1.2 oz/week    2 Glasses of wine per week     Comment: 07/15/2013 "1-2 glasses of wine on the weekends"    Review of Systems  Constitutional: Negative for appetite change.  Respiratory: Negative for shortness of breath.   Cardiovascular: Negative for chest pain.  Gastrointestinal: Negative for abdominal pain.  Musculoskeletal: Positive for back pain.  Skin: Negative for wound.  Neurological: Negative for weakness and numbness.  Hematological: Bruises/bleeds easily.      Allergies  Review of patient's allergies indicates no known allergies.  Home Medications   Prior to Admission medications   Medication Sig Start Date End Date Taking? Authorizing Provider  aspirin EC 81 MG tablet Take 1 tablet (81 mg total) by mouth daily. 07/24/13   Liliane Shi, PA-C  clopidogrel (PLAVIX) 75 MG tablet Take 1 tablet (75 mg total) by mouth daily with breakfast. 07/17/13   Rogelia Mire, NP  nitroGLYCERIN (NITROSTAT) 0.4 MG SL tablet Place 1 tablet (0.4 mg total) under the tongue every 5 (five) minutes x 3 doses as needed for chest pain. 07/17/13   Rogelia Mire, NP  rosuvastatin (CRESTOR) 40 MG tablet Take 1 tablet (40 mg total) by mouth daily. 09/14/13   Liliane Shi, PA-C  sildenafil (VIAGRA) 50 MG tablet Take 50 mg by mouth daily as needed for erectile dysfunction.    Historical Provider,  MD   BP 127/81 mmHg  Pulse 59  Temp(Src) 97.8 F (36.6 C) (Oral)  Resp 18  Ht 5\' 10"  (1.778 m)  Wt 200 lb (90.719 kg)  BMI 28.70 kg/m2  SpO2 98% Physical Exam  Cardiovascular: Normal rate and regular rhythm.   Pulmonary/Chest: Effort normal.  Abdominal: Soft. There is no tenderness.  Musculoskeletal: He exhibits tenderness.  Area of ecchymosis to left lateral elbow. No bony tenderness. Good range of motion. There is large ecchymosis to the midline lower back and extending approximately 15-20 cm across. No fluctuance. Some tenderness to the left side just lateral to the midline.   Neurological: He is alert.  Skin: Skin is warm.    ED Course  Procedures (including critical care time) Labs Review Labs Reviewed  CBC WITH DIFFERENTIAL/PLATELET - Abnormal; Notable for the following:    RBC 4.09 (*)    HCT 38.3 (*)    Monocytes Relative 15 (*)    All other components within normal limits  I-STAT CHEM 8, ED - Abnormal; Notable for the following:    Creatinine, Ser 1.70 (*)    Glucose, Bld 108 (*)    Calcium, Ion 1.08 (*)    Hemoglobin 12.6 (*)    HCT 37.0 (*)    All other components within normal limits    Imaging Review Dg Lumbar Spine Complete  01/17/2015   CLINICAL DATA:  Acute onset of back pain after fall 2 days prior.  EXAM: LUMBAR SPINE - COMPLETE 4+ VIEW  COMPARISON:  None.  FINDINGS: The alignment is maintained. Vertebral body heights are normal. There is no listhesis. The posterior elements are intact. No fracture. There is endplate spurring and flowing osteophytes involving the lower thoracic and lumbar spine. The disc spaces remain preserved. Facet arthropathy at L4-L5 and L5-S1.  IMPRESSION: 1. No fracture or subluxation of the lumbar spine. 2. Facet arthropathy at L4-L5 and L5-S1. 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   Electronically Signed   By: Jeb Levering M.D.   On: 01/17/2015 00:10     EKG Interpretation None      MDM   Final diagnoses:  Fall, initial encounter  Lumbar contusion, initial encounter    Patient with contusion of his back. Does have tenderness but negative x-ray. Is on Plavix. Does have renal sufficiency that also need to get followed. Will discharge home.    Davonna Belling, MD 01/17/15 [phone]

## 2015-01-16 NOTE — ED Notes (Signed)
Pt states on friday he was trying to move some furniture, states that he had a few drinks before and then fell down 3-4 steps. Pt has large purple bruise to lumbar region with bruise to left elbow. Pt reports that he has been using ice and epsom salt on bruises. Pt denies hitting his head when falling. Mild tenderness to lumbar area.

## 2015-01-17 NOTE — Discharge Instructions (Signed)
Contusion °A contusion is a deep bruise. Contusions are the result of an injury that caused bleeding under the skin. The contusion may turn blue, purple, or yellow. Minor injuries will give you a painless contusion, but more severe contusions may stay painful and swollen for a few weeks.  °CAUSES  °A contusion is usually caused by a blow, trauma, or direct force to an area of the body. °SYMPTOMS  °· Swelling and redness of the injured area. °· Bruising of the injured area. °· Tenderness and soreness of the injured area. °· Pain. °DIAGNOSIS  °The diagnosis can be made by taking a history and physical exam. An X-ray, CT scan, or MRI may be needed to determine if there were any associated injuries, such as fractures. °TREATMENT  °Specific treatment will depend on what area of the body was injured. In general, the best treatment for a contusion is resting, icing, elevating, and applying cold compresses to the injured area. Over-the-counter medicines may also be recommended for pain control. Ask your caregiver what the best treatment is for your contusion. °HOME CARE INSTRUCTIONS  °· Put ice on the injured area. °¨ Put ice in a plastic bag. °¨ Place a towel between your skin and the bag. °¨ Leave the ice on for 15-20 minutes, 3-4 times a day, or as directed by your health care provider. °· Only take over-the-counter or prescription medicines for pain, discomfort, or fever as directed by your caregiver. Your caregiver may recommend avoiding anti-inflammatory medicines (aspirin, ibuprofen, and naproxen) for 48 hours because these medicines may increase bruising. °· Rest the injured area. °· If possible, elevate the injured area to reduce swelling. °SEEK IMMEDIATE MEDICAL CARE IF:  °· You have increased bruising or swelling. °· You have pain that is getting worse. °· Your swelling or pain is not relieved with medicines. °MAKE SURE YOU:  °· Understand these instructions. °· Will watch your condition. °· Will get help right  away if you are not doing well or get worse. °Document Released: 05/30/2005 Document Revised: 08/25/2013 Document Reviewed: 06/25/2011 °[employer]® Patient Information ©2015 [employer]. This information is not intended to replace advice given to you by your health care provider. Make sure you discuss any questions you have with your health care provider. ° °

## 2016-07-13 ENCOUNTER — Emergency Department (HOSPITAL_COMMUNITY)
Admission: EM | Admit: 2016-07-13 | Discharge: 2016-07-14 | Disposition: A | Discharge status: Home / Self Care | Payer: Medicare Other | Attending: Emergency Medicine | Admitting: Emergency Medicine

## 2016-07-13 ENCOUNTER — Encounter (HOSPITAL_COMMUNITY): Payer: Self-pay | Admitting: *Deleted

## 2016-07-13 ENCOUNTER — Emergency Department (HOSPITAL_COMMUNITY): Payer: Medicare Other

## 2016-07-13 DIAGNOSIS — Y929 Unspecified place or not applicable: Secondary | ICD-10-CM | POA: Insufficient documentation

## 2016-07-13 DIAGNOSIS — Z23 Encounter for immunization: Secondary | ICD-10-CM | POA: Insufficient documentation

## 2016-07-13 DIAGNOSIS — Z7982 Long term (current) use of aspirin: Secondary | ICD-10-CM | POA: Insufficient documentation

## 2016-07-13 DIAGNOSIS — S0292XA Unspecified fracture of facial bones, initial encounter for closed fracture: Secondary | ICD-10-CM | POA: Insufficient documentation

## 2016-07-13 DIAGNOSIS — I2510 Atherosclerotic heart disease of native coronary artery without angina pectoris: Secondary | ICD-10-CM | POA: Diagnosis not present

## 2016-07-13 DIAGNOSIS — S01311A Laceration without foreign body of right ear, initial encounter: Secondary | ICD-10-CM | POA: Diagnosis not present

## 2016-07-13 DIAGNOSIS — Y999 Unspecified external cause status: Secondary | ICD-10-CM | POA: Diagnosis not present

## 2016-07-13 DIAGNOSIS — S0993XA Unspecified injury of face, initial encounter: Secondary | ICD-10-CM | POA: Diagnosis present

## 2016-07-13 DIAGNOSIS — S01511A Laceration without foreign body of lip, initial encounter: Secondary | ICD-10-CM

## 2016-07-13 DIAGNOSIS — Z955 Presence of coronary angioplasty implant and graft: Secondary | ICD-10-CM | POA: Insufficient documentation

## 2016-07-13 DIAGNOSIS — R0789 Other chest pain: Secondary | ICD-10-CM | POA: Diagnosis not present

## 2016-07-13 DIAGNOSIS — Y939 Activity, unspecified: Secondary | ICD-10-CM | POA: Diagnosis not present

## 2016-07-13 LAB — CBC
HCT: 42.3 % (ref 39.0–52.0)
HEMOGLOBIN: 14.5 g/dL (ref 13.0–17.0)
MCH: 32.6 pg (ref 26.0–34.0)
MCHC: 34.3 g/dL (ref 30.0–36.0)
MCV: 95.1 fL (ref 78.0–100.0)
PLATELETS: 145 10*3/uL — AB (ref 150–400)
RBC: 4.45 MIL/uL (ref 4.22–5.81)
RDW: 13.5 % (ref 11.5–15.5)
WBC: 6.1 10*3/uL (ref 4.0–10.5)

## 2016-07-13 LAB — BASIC METABOLIC PANEL
ANION GAP: 11 (ref 5–15)
BUN: 10 mg/dL (ref 6–20)
CHLORIDE: 105 mmol/L (ref 101–111)
CO2: 22 mmol/L (ref 22–32)
Calcium: 9.2 mg/dL (ref 8.9–10.3)
Creatinine, Ser: 1.14 mg/dL (ref 0.61–1.24)
GFR calc non Af Amer: >60 mL/min (ref >60)
Glucose, Bld: 101 mg/dL — ABNORMAL HIGH (ref 65–99)
POTASSIUM: 4.1 mmol/L (ref 3.5–5.1)
SODIUM: 138 mmol/L (ref 135–145)

## 2016-07-13 LAB — TROPONIN I: Troponin I: <0.03 ng/mL (ref <0.03)

## 2016-07-13 MED ORDER — ONDANSETRON HCL 4 MG/2ML IJ SOLN
4.0000 mg | Freq: Once | INTRAMUSCULAR | Status: AC
  Administered 2016-07-14: 4 mg via INTRAVENOUS
  Filled 2016-07-13: qty 2

## 2016-07-13 MED ORDER — FENTANYL CITRATE (PF) 100 MCG/2ML IJ SOLN
50.0000 ug | Freq: Once | INTRAMUSCULAR | Status: AC
  Administered 2016-07-14: 50 ug via INTRAVENOUS
  Filled 2016-07-13: qty 2

## 2016-07-13 MED ORDER — SODIUM CHLORIDE 0.9 % IV BOLUS (SEPSIS)
500.0000 mL | Freq: Once | INTRAVENOUS | Status: AC
  Administered 2016-07-14: 500 mL via INTRAVENOUS

## 2016-07-13 MED ORDER — TETANUS-DIPHTH-ACELL PERTUSSIS 5-2.5-18.5 LF-MCG/0.5 IM SUSP
0.5000 mL | Freq: Once | INTRAMUSCULAR | Status: AC
  Administered 2016-07-14: 0.5 mL via INTRAMUSCULAR
  Filled 2016-07-13: qty 0.5
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
## 2016-07-14 ENCOUNTER — Emergency Department (HOSPITAL_COMMUNITY): Payer: Medicare Other

## 2016-07-14 MED ORDER — METHOCARBAMOL 500 MG PO TABS: 500.0000 mg | ORAL_TABLET | Freq: Three times a day (TID) | ORAL | 0 refills | Status: DC | PRN

## 2016-07-14 MED ORDER — OXYCODONE-ACETAMINOPHEN 5-325 MG PO TABS: 1.0000 | ORAL_TABLET | ORAL | 0 refills | Status: DC | PRN

## 2016-07-14 MED ORDER — OXYCODONE-ACETAMINOPHEN 5-325 MG PO TABS
1.0000 | ORAL_TABLET | Freq: Once | ORAL | Status: AC
  Administered 2016-07-14: 1 via ORAL
  Filled 2016-07-14: qty 1

## 2016-07-14 MED ORDER — LIDOCAINE HCL (PF) 1 % IJ SOLN
5.0000 mL | Freq: Once | INTRAMUSCULAR | Status: AC
  Administered 2016-07-14: 5 mL via INTRADERMAL
  Filled 2016-07-14: qty 5
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
## 2016-07-14 NOTE — ED Notes (Signed)
Dermabond at bedside.  

## 2016-07-14 NOTE — ED Notes (Signed)
ED Provider at bedside. 

## 2016-07-14 NOTE — ED Provider Notes (Signed)
..  Laceration Repair Date/Time: 07/14/2016 2:21 AM Performed by: Abigail Butts Authorized by: Abigail Butts   Consent:    Consent obtained:  Verbal   Consent given by:  Patient   Risks discussed:  Infection   Alternatives discussed:  Delayed treatment Anesthesia (see MAR for exact dosages):    Anesthesia method:  Local infiltration   Local anesthetic:  Lidocaine 1% w/o epi Laceration details:    Location:  Lip   Lip location:  Upper interior lip   Length (cm):  1.5 Repair type:    Repair type:  Simple Pre-procedure details:    Preparation:  Patient was prepped and draped in usual sterile fashion Exploration:    Hemostasis achieved with:  Direct pressure   Wound exploration: entire depth of wound probed and visualized     Contaminated: no   Treatment:    Area cleansed with:  Saline   Amount of cleaning:  Standard   Irrigation solution:  Sterile water   Irrigation method:  Syringe   Visualized foreign bodies/material removed: no   Skin repair:    Repair method:  Sutures   Suture size:  5-0   Suture material:  Chromic gut   Suture technique:  Simple interrupted   Number of sutures:  2 Approximation:    Approximation:  Close   Vermilion border well-aligned: no involvement of the vermilion border.   Post-procedure details:    Dressing:  Open (no dressing)   Patient tolerance of procedure:  Tolerated well, no immediate complications      Abigail Butts, PA-C 07/14/16 [id number]    Julianne Rice, MD 07/14/16 0700

## 2017-08-20 DIAGNOSIS — I2510 Atherosclerotic heart disease of native coronary artery without angina pectoris: Secondary | ICD-10-CM | POA: Diagnosis not present

## 2017-08-20 DIAGNOSIS — I10 Essential (primary) hypertension: Secondary | ICD-10-CM | POA: Diagnosis not present

## 2017-08-20 DIAGNOSIS — E785 Hyperlipidemia, unspecified: Secondary | ICD-10-CM | POA: Diagnosis not present

## 2017-08-20 DIAGNOSIS — Z23 Encounter for immunization: Secondary | ICD-10-CM | POA: Diagnosis not present

## 2017-12-17 DIAGNOSIS — I10 Essential (primary) hypertension: Secondary | ICD-10-CM | POA: Diagnosis not present

## 2017-12-17 DIAGNOSIS — Z955 Presence of coronary angioplasty implant and graft: Secondary | ICD-10-CM | POA: Diagnosis not present

## 2017-12-17 DIAGNOSIS — E785 Hyperlipidemia, unspecified: Secondary | ICD-10-CM | POA: Diagnosis not present

## 2017-12-17 DIAGNOSIS — I2510 Atherosclerotic heart disease of native coronary artery without angina pectoris: Secondary | ICD-10-CM | POA: Diagnosis not present

## 2018-02-10 ENCOUNTER — Other Ambulatory Visit: Payer: Self-pay

## 2018-02-10 NOTE — Patient Outreach (Signed)
[facility]) Care Management  02/10/2018  Adrian [name]. 1951/07/25 [id number]   Medication Adherence call to Mr. Adrian [name] left a message for patient to call back patient is due on Atorvastatin 20 mg. [name] is past due under [place].   [place] Management Direct Dial [phone]  Fax [phone] [name]

## 2018-05-28 ENCOUNTER — Other Ambulatory Visit: Payer: Self-pay
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
## 2018-06-09 DIAGNOSIS — R740 Nonspecific elevation of levels of transaminase and lactic acid dehydrogenase [LDH]: Secondary | ICD-10-CM | POA: Diagnosis not present

## 2018-06-09 DIAGNOSIS — Z1211 Encounter for screening for malignant neoplasm of colon: Secondary | ICD-10-CM | POA: Diagnosis not present

## 2018-06-09 DIAGNOSIS — I10 Essential (primary) hypertension: Secondary | ICD-10-CM | POA: Diagnosis not present

## 2018-06-09 DIAGNOSIS — E782 Mixed hyperlipidemia: Secondary | ICD-10-CM | POA: Diagnosis not present

## 2018-06-18 DIAGNOSIS — D123 Benign neoplasm of transverse colon: Secondary | ICD-10-CM | POA: Diagnosis not present

## 2018-06-18 DIAGNOSIS — K635 Polyp of colon: Secondary | ICD-10-CM | POA: Diagnosis not present

## 2018-06-18 DIAGNOSIS — D122 Benign neoplasm of ascending colon: Secondary | ICD-10-CM | POA: Diagnosis not present

## 2018-06-18 DIAGNOSIS — K5730 Diverticulosis of large intestine without perforation or abscess without bleeding: Secondary | ICD-10-CM | POA: Diagnosis not present

## 2018-06-18 DIAGNOSIS — Z1211 Encounter for screening for malignant neoplasm of colon: Secondary | ICD-10-CM | POA: Diagnosis not present

## 2018-06-18 DIAGNOSIS — D120 Benign neoplasm of cecum: Secondary | ICD-10-CM | POA: Diagnosis not present

## 2018-07-17 DIAGNOSIS — I10 Essential (primary) hypertension: Secondary | ICD-10-CM | POA: Diagnosis not present

## 2018-07-17 DIAGNOSIS — E7800 Pure hypercholesterolemia, unspecified: Secondary | ICD-10-CM | POA: Diagnosis not present

## 2018-07-17 DIAGNOSIS — Z0001 Encounter for general adult medical examination with abnormal findings: Secondary | ICD-10-CM | POA: Diagnosis not present

## 2018-07-17 DIAGNOSIS — I2510 Atherosclerotic heart disease of native coronary artery without angina pectoris: Secondary | ICD-10-CM | POA: Diagnosis not present

## 2018-07-17 DIAGNOSIS — Z955 Presence of coronary angioplasty implant and graft: Secondary | ICD-10-CM | POA: Diagnosis not present

## 2018-09-11 DIAGNOSIS — E7800 Pure hypercholesterolemia, unspecified: Secondary | ICD-10-CM | POA: Diagnosis not present

## 2018-09-11 DIAGNOSIS — I2510 Atherosclerotic heart disease of native coronary artery without angina pectoris: Secondary | ICD-10-CM | POA: Diagnosis not present

## 2018-09-11 DIAGNOSIS — Z955 Presence of coronary angioplasty implant and graft: Secondary | ICD-10-CM | POA: Diagnosis not present

## 2018-09-11 DIAGNOSIS — I10 Essential (primary) hypertension: Secondary | ICD-10-CM | POA: Diagnosis not present

## 2018-12-04 DIAGNOSIS — Z955 Presence of coronary angioplasty implant and graft: Secondary | ICD-10-CM | POA: Diagnosis not present

## 2018-12-04 DIAGNOSIS — I10 Essential (primary) hypertension: Secondary | ICD-10-CM | POA: Diagnosis not present

## 2018-12-04 DIAGNOSIS — I2510 Atherosclerotic heart disease of native coronary artery without angina pectoris: Secondary | ICD-10-CM | POA: Diagnosis not present

## 2018-12-04 DIAGNOSIS — E7800 Pure hypercholesterolemia, unspecified: Secondary | ICD-10-CM | POA: Diagnosis not present

## 2019-04-02 ENCOUNTER — Other Ambulatory Visit: Payer: Self-pay

## 2019-04-02 ENCOUNTER — Encounter (HOSPITAL_COMMUNITY): Payer: Self-pay

## 2019-04-02 ENCOUNTER — Emergency Department (HOSPITAL_COMMUNITY)
Admission: EM | Admit: 2019-04-02 | Discharge: 2019-04-02 | Disposition: A | Discharge status: Home / Self Care | Payer: Medicare Other | Attending: Emergency Medicine | Admitting: Emergency Medicine

## 2019-04-02 DIAGNOSIS — Z7982 Long term (current) use of aspirin: Secondary | ICD-10-CM | POA: Insufficient documentation

## 2019-04-02 DIAGNOSIS — Z79899 Other long term (current) drug therapy: Secondary | ICD-10-CM | POA: Diagnosis not present

## 2019-04-02 DIAGNOSIS — I2510 Atherosclerotic heart disease of native coronary artery without angina pectoris: Secondary | ICD-10-CM | POA: Insufficient documentation

## 2019-04-02 DIAGNOSIS — R1013 Epigastric pain: Secondary | ICD-10-CM | POA: Diagnosis not present

## 2019-04-02 LAB — COMPREHENSIVE METABOLIC PANEL
ALT: 52 U/L — ABNORMAL HIGH (ref 0–44)
AST: 72 U/L — ABNORMAL HIGH (ref 15–41)
Albumin: 4.4 g/dL (ref 3.5–5.0)
Alkaline Phosphatase: 132 U/L — ABNORMAL HIGH (ref 38–126)
Anion gap: 15 (ref 5–15)
BUN: 16 mg/dL (ref 8–23)
CO2: 24 mmol/L (ref 22–32)
Calcium: 9.5 mg/dL (ref 8.9–10.3)
Chloride: 102 mmol/L (ref 98–111)
Creatinine, Ser: 1.21 mg/dL (ref 0.61–1.24)
GFR calc Af Amer: >60 mL/min (ref >60)
GFR calc non Af Amer: >60 mL/min (ref >60)
Glucose, Bld: 90 mg/dL (ref 70–99)
Potassium: 3.8 mmol/L (ref 3.5–5.1)
Sodium: 141 mmol/L (ref 135–145)
Total Bilirubin: 0.9 mg/dL (ref 0.3–1.2)
Total Protein: 8.5 g/dL — ABNORMAL HIGH (ref 6.5–8.1)

## 2019-04-02 LAB — CBC WITH DIFFERENTIAL/PLATELET
Abs Immature Granulocytes: 0.01 10*3/uL (ref 0.00–0.07)
Basophils Absolute: 0 10*3/uL (ref 0.0–0.1)
Basophils Relative: 0 %
Eosinophils Absolute: 0 10*3/uL (ref 0.0–0.5)
Eosinophils Relative: 0 %
HCT: 41.8 % (ref 39.0–52.0)
Hemoglobin: 14.1 g/dL (ref 13.0–17.0)
Immature Granulocytes: 0 %
Lymphocytes Relative: 12 %
Lymphs Abs: 0.6 10*3/uL — ABNORMAL LOW (ref 0.7–4.0)
MCH: 34.1 pg — ABNORMAL HIGH (ref 26.0–34.0)
MCHC: 33.7 g/dL (ref 30.0–36.0)
MCV: 101.2 fL — ABNORMAL HIGH (ref 80.0–100.0)
Monocytes Absolute: 0.4 10*3/uL (ref 0.1–1.0)
Monocytes Relative: 9 %
Neutro Abs: 3.5 10*3/uL (ref 1.7–7.7)
Neutrophils Relative %: 79 %
Platelets: 155 10*3/uL (ref 150–400)
RBC: 4.13 MIL/uL — ABNORMAL LOW (ref 4.22–5.81)
RDW: 12.9 % (ref 11.5–15.5)
WBC: 4.5 10*3/uL (ref 4.0–10.5)
nRBC: 0 % (ref 0.0–0.2)

## 2019-04-02 LAB — TROPONIN I (HIGH SENSITIVITY): Troponin I (High Sensitivity): 4 ng/L (ref <18)

## 2019-04-02 LAB — LIPASE, BLOOD: Lipase: 44 U/L (ref 11–51)

## 2019-04-02 MED ORDER — FAMOTIDINE IN NACL 20-0.9 MG/50ML-% IV SOLN
20.0000 mg | Freq: Once | INTRAVENOUS | Status: AC
  Administered 2019-04-02: 20 mg via INTRAVENOUS
  Filled 2019-04-02: qty 50

## 2019-04-02 MED ORDER — SUCRALFATE 1 G PO TABS: 1.0000 g | ORAL_TABLET | Freq: Three times a day (TID) | ORAL | 1 refills | Status: DC

## 2019-04-02 MED ORDER — SODIUM CHLORIDE 0.9 % IV SOLN
INTRAVENOUS | Status: DC | PRN
  Administered 2019-04-02: 19:00:00 via INTRAVENOUS

## 2019-04-02 MED ORDER — FAMOTIDINE 20 MG PO TABS: 20.0000 mg | ORAL_TABLET | Freq: Two times a day (BID) | ORAL | 1 refills | Status: DC

## 2019-04-02 NOTE — ED Provider Notes (Signed)
[place] DEPT Provider Note   CSN: [id number] Arrival date & time: 04/02/19  1500    History   Chief Complaint Chief Complaint  Patient presents with  . Gastroesophageal Reflux    HPI Adrian [name]. is a 68 y.o. male with a PMH of CAD with stent, gout, and hyperlipidemia who presents with epigastric pain that he feels is due to gastric reflux.  He says that he has been drinking more than normal over the last 2 months due to his anxiety regarding COVID.  He says that he has been drinking about 2 fifths of wine daily as well as occasional beer.  He thinks that his alcohol use is causing his epigastric pain.  He describes his pain as burning and it does not radiate.  It is not associated with activity and is not accompanied by shortness of breath or diaphoresis.  He does have some nausea and has been vomiting.  His vomit has not contained blood.  He is eating and drinking well.  His bowel movements have been normal and have not contained blood or black material.   Past Medical History:  Diagnosis Date  . Coronary artery disease    a. 2008 s/p MI [facility], [place]) with stenting of the OM1;  b. 07/2013 Cath/PCI: LM nl, LAD 80p (3.0x18 Xience DES), 15m, D1 95ost, LCX 30-72m, OM1 50-60p ISR, OM2 99ost, 80-90p/m (2.25x32 Promus DES), OM3 60ost, RCA 30ost, EF 45-50 basal-mid inf HK.  . Gout   . Hyperlipidemia     Patient Active Problem List   Diagnosis Date Noted  . Unstable angina ([place]) 07/17/2013  . Hyperlipidemia   . Coronary artery disease   . Angina of effort ([place]) 07/15/2013    Past Surgical History:  Procedure Laterality Date  . CATARACT EXTRACTION W/ INTRAOCULAR LENS  IMPLANT, BILATERAL Bilateral 2007  . CORONARY ANGIOPLASTY WITH STENT PLACEMENT  2008   [facility], [place], 1 stent, possibly to the LAD  . LEFT HEART CATHETERIZATION WITH CORONARY ANGIOGRAM N/A 07/16/2013   Procedure: LEFT HEART CATHETERIZATION WITH CORONARY ANGIOGRAM;   Surgeon: Jolaine Artist, MD;  Location: [facility] CATH LAB;  Service: Cardiovascular;  Laterality: N/A;  . TONSILLECTOMY  ~ 1960        Home Medications    Prior to Admission medications   Medication Sig Start Date End Date Taking? Authorizing Provider  aspirin EC 81 MG tablet Take 1 tablet (81 mg total) by mouth daily. 07/24/13   Richardson Dopp T, PA-C  atorvastatin (LIPITOR) 20 MG tablet Take 20 mg by mouth daily at 6 PM.    [provider]  esomeprazole (NEXIUM) 20 MG capsule Take 20 mg by mouth daily as needed (for heartburn).    [provider]  famotidine (PEPCID) 20 MG tablet Take 1 tablet (20 mg total) by mouth 2 (two) times daily. 04/02/19 06/01/19  Kathrene Alu, MD  methocarbamol (ROBAXIN) 500 MG tablet Take 1 tablet (500 mg total) by mouth every 8 (eight) hours as needed for muscle spasms. 07/14/16   Julianne Rice, MD  metoprolol tartrate (LOPRESSOR) 25 MG tablet Take 25 mg by mouth 2 (two) times daily.    [provider]  naproxen sodium (ANAPROX) 220 MG tablet Take 220 mg by mouth 2 (two) times daily as needed (for pain).    [provider]  nitroGLYCERIN (NITROSTAT) 0.4 MG SL tablet Place 1 tablet (0.4 mg total) under the tongue every 5 (five) minutes x 3 doses  as needed for chest pain. 07/17/13   Theora Gianotti, NP  oxyCODONE-acetaminophen (PERCOCET) 5-325 MG tablet Take 1-2 tablets by mouth every 4 (four) hours as needed. 07/14/16   Julianne Rice, MD  sucralfate (CARAFATE) 1 g tablet Take 1 tablet (1 g total) by mouth 4 (four) times daily -  with meals and at bedtime. 04/02/19 06/01/19  Kathrene Alu, MD    Family History Family History  Problem Relation Age of Onset  . Coronary artery disease Mother   . Heart attack Father     Social History Social History   Tobacco Use  . Smoking status: Never Smoker  . Smokeless tobacco: Never Used  Substance Use Topics  . Alcohol use: Yes    Alcohol/week: 2.0 standard  drinks    Types: 2 Glasses of wine per week    Comment: 07/15/2013 "1-2 glasses of wine on the weekends"  . Drug use: No     Allergies   Patient has no known allergies.   Review of Systems Review of Systems  Constitutional: Negative for activity change, appetite change, chills, fatigue and fever.  HENT: Negative for congestion.   Respiratory: Negative for cough, chest tightness and shortness of breath.   Cardiovascular: Negative for chest pain.  Gastrointestinal: Positive for abdominal pain (epigastric). Negative for blood in stool, constipation, diarrhea and nausea.  Genitourinary: Negative for dysuria.  Musculoskeletal: Negative for back pain.  Neurological: Negative for weakness and headaches.  Psychiatric/Behavioral: The patient is nervous/anxious.      Physical Exam Updated Vital Signs BP 140/88   Pulse 67   Temp 98.2 F (36.8 C) (Oral)   Resp 13   Ht 5\' 10"  (1.778 m)   Wt 82.8 kg   SpO2 100%   BMI 26.19 kg/m   Physical Exam Constitutional:      General: He is not in acute distress.    Appearance: Normal appearance. He is normal weight.  HENT:     Head: Normocephalic and atraumatic.     Nose: Nose normal. No congestion or rhinorrhea.     Mouth/Throat:     Mouth: Mucous membranes are moist.  Eyes:     Extraocular Movements: Extraocular movements intact.     Conjunctiva/sclera: Conjunctivae normal.  Neck:     Musculoskeletal: Normal range of motion.  Cardiovascular:     Rate and Rhythm: Normal rate and regular rhythm.     Pulses: Normal pulses.     Heart sounds: Normal heart sounds.  Pulmonary:     Effort: Pulmonary effort is normal.     Breath sounds: Normal breath sounds.  Abdominal:     General: Abdomen is flat. Bowel sounds are normal. There is no distension.     Palpations: Abdomen is soft.     Tenderness: There is no abdominal tenderness.     Comments: Frequently spitting during exam  Neurological:     Mental Status: He is alert.      ED  Treatments / Results  Labs (all labs ordered are listed, but only abnormal results are displayed) Labs Reviewed  CBC WITH DIFFERENTIAL/PLATELET - Abnormal; Notable for the following components:      Result Value   RBC 4.13 (*)    MCV 101.2 (*)    MCH 34.1 (*)    Lymphs Abs 0.6 (*)    All other components within normal limits  COMPREHENSIVE METABOLIC PANEL - Abnormal; Notable for the following components:   Total Protein 8.5 (*)    AST 72 (*)  ALT 52 (*)    Alkaline Phosphatase 132 (*)    All other components within normal limits  LIPASE, BLOOD  TROPONIN I (HIGH SENSITIVITY)    EKG None  Radiology No results found.  Procedures Procedures (including critical care time)  Medications Ordered in ED Medications  0.9 %  sodium chloride infusion ( Intravenous New Bag/Given 04/02/19 1840)  famotidine (PEPCID) IVPB 20 mg premix (0 mg Intravenous Stopped 04/02/19 1911)     Initial Impression / Assessment and Plan / ED Course  I have reviewed the triage vital signs and the nursing notes.  Pertinent labs & imaging results that were available during my care of the patient were reviewed by me and considered in my medical decision making (see chart for details).       Although GERD is unlikely cause for patient's symptoms, given his history of CAD, we will also obtain troponin and EKG.  Will obtain CBC, lipase, and CMP as well.  We will give famotidine 20 mg once IV for symptom relief.  Labs and EKG are reassuring.  Patient was counseled that reducing or stopping alcohol intake will improve his GERD and overall health.  He also was told that his LFTs and MCV were a little elevated, which could be due to his alcohol use.  Patient says that he wants to stop drinking.  He was given prescriptions for Pepcid 20 mg twice daily and sucralfate 4 times daily for 2 months.  He was told that if his symptoms do not resolve with cessation of alcohol and with these medications, he should seek  medical care.  He expressed understanding and was felt to be appropriate for discharge.  Final Clinical Impressions(s) / ED Diagnoses   Final diagnoses:  Epigastric abdominal pain    ED Discharge Orders         Ordered    famotidine (PEPCID) 20 MG tablet  2 times daily     04/02/19 2033    sucralfate (CARAFATE) 1 g tablet  3 times daily with meals & bedtime     04/02/19 2033           Kathrene Alu, MD 04/02/19 2043    Carmin Muskrat, MD 04/02/19 2125

## 2019-04-02 NOTE — Discharge Instructions (Signed)
Your labs looked overall normal today, but there are some concerns that alcohol is hurting your liver.  Please reduce your alcohol intake or, even better, stop drinking alcohol.  This will help your reflux and be good for your overall health.  We are sending to medications to help with your reflux.  One is sucralfate, which you can take up to 4 times per day.  The other is Pepcid, which you can take up to twice per day.  If your symptoms continue, please follow-up with your primary doctor.  If you do not have a primary doctor, please follow the directions in this packet to find one.  We hope you feel better.

## 2019-04-02 NOTE — ED Triage Notes (Signed)
Patient states he has been drinking wine daily x a couple of months. Patient states his GERD is "acting up." Paitent states he has been taking "MOM."

## 2019-04-03 ENCOUNTER — Other Ambulatory Visit: Payer: Self-pay | Admitting: Family Medicine

## 2019-06-19 ENCOUNTER — Other Ambulatory Visit: Payer: Self-pay

## 2019-06-19 DIAGNOSIS — Z20822 Contact with and (suspected) exposure to covid-19: Secondary | ICD-10-CM

## 2019-06-21 LAB — NOVEL CORONAVIRUS, NAA: SARS-CoV-2, NAA: NOT DETECTED

## 2019-07-16 ENCOUNTER — Other Ambulatory Visit: Payer: Self-pay

## 2019-07-16 NOTE — Patient Outreach (Signed)
[facility]) Care Management  07/16/2019  Adrian [name]. Aug 31, 1951 [id number]   Medication Adherence call to Adrian [name] [facility] Identifiers Verify spoke with patient he is past due on Atorvastatin 40 mg,patient explain he takes 1 tablet daily,patient is in the process of switching doctors,patient ask to call [facility] to [facility] for a [facility]ill,let patient know that doctor might not fill the prescription since he is in the process of switching doctors and might want to ask his new doctor for a prescription in a week.Adrian [name] is showing past due under Adrian [name].   [facility] Management Direct Dial [phone]  Fax [phone] [email or web address]

## 2020-01-09 ENCOUNTER — Encounter (HOSPITAL_COMMUNITY): Payer: Self-pay | Admitting: Emergency Medicine

## 2020-01-09 ENCOUNTER — Emergency Department (HOSPITAL_COMMUNITY)
Admission: EM | Admit: 2020-01-09 | Discharge: 2020-01-09 | Disposition: A | Discharge status: Home / Self Care | Payer: Medicare Other | Attending: Emergency Medicine | Admitting: Emergency Medicine

## 2020-01-09 ENCOUNTER — Other Ambulatory Visit: Payer: Self-pay

## 2020-01-09 DIAGNOSIS — R1013 Epigastric pain: Secondary | ICD-10-CM | POA: Insufficient documentation

## 2020-01-09 DIAGNOSIS — I10 Essential (primary) hypertension: Secondary | ICD-10-CM | POA: Insufficient documentation

## 2020-01-09 DIAGNOSIS — Z7982 Long term (current) use of aspirin: Secondary | ICD-10-CM | POA: Insufficient documentation

## 2020-01-09 DIAGNOSIS — I2510 Atherosclerotic heart disease of native coronary artery without angina pectoris: Secondary | ICD-10-CM | POA: Insufficient documentation

## 2020-01-09 DIAGNOSIS — R109 Unspecified abdominal pain: Secondary | ICD-10-CM | POA: Diagnosis present

## 2020-01-09 DIAGNOSIS — Z79899 Other long term (current) drug therapy: Secondary | ICD-10-CM | POA: Insufficient documentation

## 2020-01-09 LAB — LIPASE, BLOOD: Lipase: 45 U/L (ref 11–51)

## 2020-01-09 LAB — COMPREHENSIVE METABOLIC PANEL
ALT: 28 U/L (ref 0–44)
AST: 30 U/L (ref 15–41)
Albumin: 4.4 g/dL (ref 3.5–5.0)
Alkaline Phosphatase: 119 U/L (ref 38–126)
Anion gap: 10 (ref 5–15)
BUN: 13 mg/dL (ref 8–23)
CO2: 27 mmol/L (ref 22–32)
Calcium: 9.1 mg/dL (ref 8.9–10.3)
Chloride: 101 mmol/L (ref 98–111)
Creatinine, Ser: 1.07 mg/dL (ref 0.61–1.24)
GFR calc Af Amer: >60 mL/min (ref >60)
GFR calc non Af Amer: >60 mL/min (ref >60)
Glucose, Bld: 102 mg/dL — ABNORMAL HIGH (ref 70–99)
Potassium: 4 mmol/L (ref 3.5–5.1)
Sodium: 138 mmol/L (ref 135–145)
Total Bilirubin: 0.4 mg/dL (ref 0.3–1.2)
Total Protein: 8 g/dL (ref 6.5–8.1)

## 2020-01-09 LAB — CBC WITH DIFFERENTIAL/PLATELET
Abs Immature Granulocytes: 0.01 10*3/uL (ref 0.00–0.07)
Basophils Absolute: 0 10*3/uL (ref 0.0–0.1)
Basophils Relative: 1 %
Eosinophils Absolute: 0.1 10*3/uL (ref 0.0–0.5)
Eosinophils Relative: 3 %
HCT: 41.8 % (ref 39.0–52.0)
Hemoglobin: 13.8 g/dL (ref 13.0–17.0)
Immature Granulocytes: 0 %
Lymphocytes Relative: 32 %
Lymphs Abs: 1.2 10*3/uL (ref 0.7–4.0)
MCH: 32.9 pg (ref 26.0–34.0)
MCHC: 33 g/dL (ref 30.0–36.0)
MCV: 99.8 fL (ref 80.0–100.0)
Monocytes Absolute: 0.4 10*3/uL (ref 0.1–1.0)
Monocytes Relative: 12 %
Neutro Abs: 2 10*3/uL (ref 1.7–7.7)
Neutrophils Relative %: 52 %
Platelets: 141 10*3/uL — ABNORMAL LOW (ref 150–400)
RBC: 4.19 MIL/uL — ABNORMAL LOW (ref 4.22–5.81)
RDW: 13.1 % (ref 11.5–15.5)
WBC: 3.7 10*3/uL — ABNORMAL LOW (ref 4.0–10.5)
nRBC: 0 % (ref 0.0–0.2)

## 2020-01-09 MED ORDER — ALUM & MAG HYDROXIDE-SIMETH 200-200-20 MG/5ML PO SUSP
30.0000 mL | Freq: Once | ORAL | Status: AC
  Administered 2020-01-09: 30 mL via ORAL
  Filled 2020-01-09: qty 30

## 2020-01-09 NOTE — ED Triage Notes (Signed)
Patient states he ate chicken with his brother that didn't taste right. Patient complaining of epigastric pain and no other symptoms since 1900.

## 2020-01-09 NOTE — ED Provider Notes (Signed)
[facility] DEPT Provider Note   CSN: [id number] Arrival date & time: 01/09/20  0319     History Chief Complaint  Patient presents with  . Abdominal Pain    Adrian [name]. is a 69 y.o. male with past medical history significant for CAD, HTN, HLD, and GERD who presents to the ED with complaints of epigastric abdominal discomfort since 7 PM last evening.  Reviewed patient's medical record and he was evaluated on 04/02/2019 for similar epigastric discomfort and was discharged with H2 blockers and instructi also ons to discontinue alcohol use.  On my examination, patient reports that he is symptoms have already improved.  They started off as a 9 out of 10 discomfort located in epigastrium, but has improved to a 2 out of 10.  He does not take his reflux medications.  He states that his symptoms began shortly after eating "stale chicken".  He endorses drinking 750 mL wine each night.  He denies any other symptoms.  No obvious aggravating or alleviating factors.  Patient reports that he sees Dustin Folks APN for primary care here in [place], [place].  He denies any recent illness, fevers or chills, chest pain or shortness of breath, nausea or vomiting, urinary symptoms, melena, hematochezia, or changes in bowel habits.  HPI     Past Medical History:  Diagnosis Date  . Coronary artery disease    a. 2008 s/p MI [facility], [place]) with stenting of the OM1;  b. 07/2013 Cath/PCI: LM nl, LAD 80p (3.0x18 Xience DES), 59m, D1 95ost, LCX 30-46m, OM1 50-60p ISR, OM2 99ost, 80-90p/m (2.25x32 Promus DES), OM3 60ost, RCA 30ost, EF 45-50 basal-mid inf HK.  . Gout   . Hyperlipidemia     Patient Active Problem List   Diagnosis Date Noted  . Unstable angina ([place]) 07/17/2013  . Hyperlipidemia   . Coronary artery disease   . Angina of effort ([place]) 07/15/2013    Past Surgical History:  Procedure Laterality Date  . CATARACT EXTRACTION W/ INTRAOCULAR LENS  IMPLANT, BILATERAL Bilateral  2007  . CORONARY ANGIOPLASTY WITH STENT PLACEMENT  2008   [facility], [place], 1 stent, possibly to the LAD  . LEFT HEART CATHETERIZATION WITH CORONARY ANGIOGRAM N/A 07/16/2013   Procedure: LEFT HEART CATHETERIZATION WITH CORONARY ANGIOGRAM;  Surgeon: Jolaine Artist, MD;  Location: [facility] CATH LAB;  Service: Cardiovascular;  Laterality: N/A;  . TONSILLECTOMY  ~ 1960       Family History  Problem Relation Age of Onset  . Coronary artery disease Mother   . Heart attack Father     Social History   Tobacco Use  . Smoking status: Never Smoker  . Smokeless tobacco: Never Used  Substance Use Topics  . Alcohol use: Yes    Alcohol/week: 2.0 standard drinks    Types: 2 Glasses of wine per week    Comment: 07/15/2013 "1-2 glasses of wine on the weekends"  . Drug use: No    Home Medications Prior to Admission medications   Medication Sig Start Date End Date Taking? Authorizing Provider  aspirin EC 81 MG tablet Take 1 tablet (81 mg total) by mouth daily. 07/24/13  Yes Weaver, Scott T, PA-C  atorvastatin (LIPITOR) 40 MG tablet Take 40 mg by mouth daily. 12/04/19  Yes [provider]  esomeprazole (NEXIUM) 20 MG capsule Take 20 mg by mouth daily as needed (for heartburn).   Yes [provider]  losartan (COZAAR) 25 MG tablet Take 25 mg by mouth  daily. 12/15/19  Yes [provider]  metoprolol tartrate (LOPRESSOR) 25 MG tablet Take 25 mg by mouth daily.    Yes [provider]  nitroGLYCERIN (NITROSTAT) 0.4 MG SL tablet Place 1 tablet (0.4 mg total) under the tongue every 5 (five) minutes x 3 doses as needed for chest pain. 07/17/13  Yes Theora Gianotti, NP  famotidine (PEPCID) 20 MG tablet Take 1 tablet (20 mg total) by mouth 2 (two) times daily. Patient not taking: Reported on 01/09/2020 04/02/19 06/01/19  Kathrene Alu, MD  methocarbamol (ROBAXIN) 500 MG tablet Take 1 tablet (500 mg total) by mouth every 8 (eight) hours as needed for  muscle spasms. Patient not taking: Reported on 01/09/2020 07/14/16   Julianne Rice, MD  oxyCODONE-acetaminophen (PERCOCET) 5-325 MG tablet Take 1-2 tablets by mouth every 4 (four) hours as needed. Patient not taking: Reported on 01/09/2020 07/14/16   Julianne Rice, MD  sucralfate (CARAFATE) 1 g tablet Take 1 tablet (1 g total) by mouth 4 (four) times daily -  with meals and at bedtime. Patient not taking: Reported on 01/09/2020 04/02/19 06/01/19  Kathrene Alu, MD    Allergies    Patient has no known allergies.  Review of Systems   Review of Systems  All other systems reviewed and are negative.   Physical Exam Updated Vital Signs BP 126/61   Pulse 72   Temp 98.2 F (36.8 C) (Oral)   Resp 20   Ht 5\' 10"  (1.778 m)   Wt 83.9 kg   SpO2 100%   BMI 26.54 kg/m   Physical Exam Vitals and nursing note reviewed. Exam conducted with a chaperone present.  Constitutional:      General: He is not in acute distress.    Appearance: Normal appearance. He is not ill-appearing.  HENT:     Head: Normocephalic and atraumatic.  Eyes:     General: No scleral icterus.    Conjunctiva/sclera: Conjunctivae normal.  Cardiovascular:     Rate and Rhythm: Normal rate and regular rhythm.     Pulses: Normal pulses.     Heart sounds: Normal heart sounds.  Pulmonary:     Effort: Pulmonary effort is normal. No respiratory distress.     Breath sounds: Normal breath sounds.  Abdominal:     Comments: Soft, nondistended.  No areas of TTP.  No guarding.  No overlying skin changes.  Normoactive bowel sounds.  Musculoskeletal:     Cervical back: Normal range of motion.  Skin:    General: Skin is dry.  Neurological:     Mental Status: He is alert.     GCS: GCS eye subscore is 4. GCS verbal subscore is 5. GCS motor subscore is 6.  Psychiatric:        Mood and Affect: Mood normal.        Behavior: Behavior normal.        Thought Content: Thought content normal.     ED Results / Procedures /  Treatments   Labs (all labs ordered are listed, but only abnormal results are displayed) Labs Reviewed  CBC WITH DIFFERENTIAL/PLATELET - Abnormal; Notable for the following components:      Result Value   WBC 3.7 (*)    RBC 4.19 (*)    Platelets 141 (*)    All other components within normal limits  COMPREHENSIVE METABOLIC PANEL - Abnormal; Notable for the following components:   Glucose, Bld 102 (*)    All other components within normal limits  LIPASE, BLOOD    EKG EKG Interpretation  Date/Time:  Saturday Jan 09 2020 07:01:39 EDT Ventricular Rate:  65 PR Interval:    QRS Duration: 103 QT Interval:  409 QTC Calculation: 426 R Axis:   17 Text Interpretation: Sinus rhythm Low voltage, precordial leads No STEMI Confirmed by Nanda Quinton [phone]) on 01/09/2020 7:05:56 AM   Radiology No results found.  Procedures Procedures (including critical care time)  Medications Ordered in ED Medications  alum & mag hydroxide-simeth (MAALOX/MYLANTA) 200-200-20 MG/5ML suspension 30 mL (30 mLs Oral Given 01/09/20 0845)    ED Course  I have reviewed the triage vital signs and the nursing notes.  Pertinent labs & imaging results that were available during my care of the patient were reviewed by me and considered in my medical decision making (see chart for details).    MDM Rules/Calculators/A&P                      Patient reports that his abdominal discomfort stemmed from eating bad chicken at a restaurant last evening.  Patient does have a history of GERD, however states that he has not been experiencing any significant epigastric or chest burning discomfort in quite a while.  Do not feel as though he needs to be started on prophylactic antacids, but recommending that he obtain Maalox over-the-counter as an abortive therapy.  Patient states that he regularly checks his stool and they appear yellowish-brown.  No melena or hematochezia.  While he admits to alcohol consumption, denies any  tobacco use or NSAIDs concerning for PUD.  He states that his symptoms have entirely improved during his stay here in the ED.  He does believe that the Maalox may have helped.  Possible alcoholic gastritis given his symptoms and lack of N/V/D.  Patient's laboratory work-up is entirely unremarkable aside from mild leukopenia to 3.7.  No electrolyte derangement or impaired renal function.  His lipase was normal at 45.  His vital signs have been stable and WNL.  Given his reassuring physical exam, do not feel as though any imaging is warranted.  Advised to reduce his alcohol consumption and follow-up with his primary care provider for ongoing evaluation and management.  This patient presents with abdominal pain of unclear etiology. Their evaluation has not identified a emergent etiology for the abdominal pain. Specifically, given the very benign exam, normal laboratory studies, and lack of significant risk factors, I have a very low suspicion for appendicitis, ischemic bowel, bowel perforation, or any other life threatening disease. I have discussed with the patient the level of uncertainty with undifferentiated abdominal pain and clearly explained the need to follow-up as noted on the discharge instructions, or return to the Emergency Department immediately if the pain worsens, develops fever, persistent and uncontrollable vomiting, or for any new symptoms or concerns. I discussed with the patient that this presentation today for abdominal pain could represent a significant risk for an acute abdominal process. Although the tests in the ED were essentially normal, there is still a possibility of a process such as appendicitis, diverticulitis, cholecystitis, ulcer, early bowel obstruction, mesenteric ischemia, kidney stone, or even kidney infection which could subsequently cause disability or death. The patient understands that they must return within 24 hours for a recheck or see their physician within 24 hours for  re-exam due to the possibility of significant surgical or medical process.   Final Clinical Impression(s) / ED Diagnoses Final diagnoses:  Epigastric abdominal pain    Rx /  DC Orders ED Discharge Orders    None       Reita Chard 01/09/20 [id number]    Margette Fast, MD 01/10/20 [phone]

## 2020-01-09 NOTE — Discharge Instructions (Signed)
Please follow-up with your primary care provider, Dustin Folks, regarding today's encounter.  I would like for you to acquire Maalox over-the-counter at a pharmacy and take that the next time you experience similar symptoms of discomfort.  Your laboratory work-up and physical exam was reassuring.  While you feel entirely improved, please return to the ED or seek immediate medical attention should you experience any new or worsening symptoms.

## 2020-04-30 ENCOUNTER — Emergency Department (HOSPITAL_COMMUNITY): Payer: Medicare Other

## 2020-04-30 ENCOUNTER — Other Ambulatory Visit: Payer: Self-pay

## 2020-04-30 ENCOUNTER — Emergency Department (HOSPITAL_COMMUNITY)
Admission: EM | Admit: 2020-04-30 | Discharge: 2020-04-30 | Disposition: A | Discharge status: Home / Self Care | Payer: Medicare Other | Attending: Emergency Medicine | Admitting: Emergency Medicine

## 2020-04-30 DIAGNOSIS — Z7982 Long term (current) use of aspirin: Secondary | ICD-10-CM | POA: Insufficient documentation

## 2020-04-30 DIAGNOSIS — R066 Hiccough: Secondary | ICD-10-CM | POA: Diagnosis not present

## 2020-04-30 DIAGNOSIS — Z79899 Other long term (current) drug therapy: Secondary | ICD-10-CM | POA: Diagnosis not present

## 2020-04-30 DIAGNOSIS — R1013 Epigastric pain: Secondary | ICD-10-CM | POA: Diagnosis present

## 2020-04-30 DIAGNOSIS — I2510 Atherosclerotic heart disease of native coronary artery without angina pectoris: Secondary | ICD-10-CM | POA: Diagnosis not present

## 2020-04-30 DIAGNOSIS — R112 Nausea with vomiting, unspecified: Secondary | ICD-10-CM | POA: Diagnosis not present

## 2020-04-30 LAB — CBC WITH DIFFERENTIAL/PLATELET
Abs Immature Granulocytes: 0.01 10*3/uL (ref 0.00–0.07)
Basophils Absolute: 0 10*3/uL (ref 0.0–0.1)
Basophils Relative: 0 %
Eosinophils Absolute: 0 10*3/uL (ref 0.0–0.5)
Eosinophils Relative: 0 %
HCT: 38 % — ABNORMAL LOW (ref 39.0–52.0)
Hemoglobin: 13 g/dL (ref 13.0–17.0)
Immature Granulocytes: 0 %
Lymphocytes Relative: 11 %
Lymphs Abs: 0.5 10*3/uL — ABNORMAL LOW (ref 0.7–4.0)
MCH: 34.3 pg — ABNORMAL HIGH (ref 26.0–34.0)
MCHC: 34.2 g/dL (ref 30.0–36.0)
MCV: 100.3 fL — ABNORMAL HIGH (ref 80.0–100.0)
Monocytes Absolute: 0.4 10*3/uL (ref 0.1–1.0)
Monocytes Relative: 9 %
Neutro Abs: 3.8 10*3/uL (ref 1.7–7.7)
Neutrophils Relative %: 80 %
Platelets: 144 10*3/uL — ABNORMAL LOW (ref 150–400)
RBC: 3.79 MIL/uL — ABNORMAL LOW (ref 4.22–5.81)
RDW: 13.6 % (ref 11.5–15.5)
WBC: 4.8 10*3/uL (ref 4.0–10.5)
nRBC: 0 % (ref 0.0–0.2)

## 2020-04-30 LAB — COMPREHENSIVE METABOLIC PANEL
ALT: 27 U/L (ref 0–44)
AST: 28 U/L (ref 15–41)
Albumin: 4.3 g/dL (ref 3.5–5.0)
Alkaline Phosphatase: 121 U/L (ref 38–126)
Anion gap: 10 (ref 5–15)
BUN: 16 mg/dL (ref 8–23)
CO2: 26 mmol/L (ref 22–32)
Calcium: 9.3 mg/dL (ref 8.9–10.3)
Chloride: 100 mmol/L (ref 98–111)
Creatinine, Ser: 1.06 mg/dL (ref 0.61–1.24)
GFR calc Af Amer: >60 mL/min (ref >60)
GFR calc non Af Amer: >60 mL/min (ref >60)
Glucose, Bld: 133 mg/dL — ABNORMAL HIGH (ref 70–99)
Potassium: 4.3 mmol/L (ref 3.5–5.1)
Sodium: 136 mmol/L (ref 135–145)
Total Bilirubin: 0.5 mg/dL (ref 0.3–1.2)
Total Protein: 8.1 g/dL (ref 6.5–8.1)

## 2020-04-30 LAB — TROPONIN I (HIGH SENSITIVITY)
Troponin I (High Sensitivity): 5 ng/L (ref <18)
Troponin I (High Sensitivity): 5 ng/L (ref <18)

## 2020-04-30 LAB — TYPE AND SCREEN
ABO/RH(D): B POS
Antibody Screen: NEGATIVE

## 2020-04-30 LAB — LIPASE, BLOOD: Lipase: 39 U/L (ref 11–51)

## 2020-04-30 MED ORDER — PANTOPRAZOLE SODIUM 20 MG PO TBEC: 40.0000 mg | DELAYED_RELEASE_TABLET | Freq: Every day | ORAL | 0 refills | Status: DC

## 2020-04-30 MED ORDER — FENTANYL CITRATE (PF) 100 MCG/2ML IJ SOLN
100.0000 ug | Freq: Once | INTRAMUSCULAR | Status: AC
  Administered 2020-04-30: 100 ug via INTRAVENOUS
  Filled 2020-04-30: qty 2

## 2020-04-30 MED ORDER — ONDANSETRON HCL 4 MG/2ML IJ SOLN
4.0000 mg | Freq: Once | INTRAMUSCULAR | Status: AC
  Administered 2020-04-30: 4 mg via INTRAVENOUS
  Filled 2020-04-30: qty 2

## 2020-04-30 MED ORDER — LIDOCAINE VISCOUS HCL 2 % MT SOLN
15.0000 mL | Freq: Once | OROMUCOSAL | Status: AC
  Administered 2020-04-30: 15 mL via ORAL
  Filled 2020-04-30: qty 15

## 2020-04-30 MED ORDER — ALUM & MAG HYDROXIDE-SIMETH 200-200-20 MG/5ML PO SUSP
30.0000 mL | Freq: Once | ORAL | Status: AC
  Administered 2020-04-30: 30 mL via ORAL
  Filled 2020-04-30: qty 30

## 2020-04-30 MED ORDER — SODIUM CHLORIDE 0.9 % IV BOLUS
1000.0000 mL | Freq: Once | INTRAVENOUS | Status: AC
  Administered 2020-04-30: 1000 mL via INTRAVENOUS

## 2020-04-30 MED ORDER — ONDANSETRON 4 MG PO TBDP: 4.0000 mg | ORAL_TABLET | Freq: Three times a day (TID) | ORAL | 0 refills | Status: AC | PRN

## 2020-04-30 NOTE — ED Triage Notes (Signed)
Patient reports he took viagra last night and drank wine. Patient reports around 3 am he began vomiting brownish red substance. Thrown up 6 times since this morning.

## 2020-04-30 NOTE — ED Provider Notes (Signed)
[facility] DEPT Provider Note   CSN: [id number] Arrival date & time: 04/30/20  9833     History Chief Complaint  Patient presents with  . Emesis    Adrian [name]. is a 69 y.o. male.  HPI     69yo male with history of CAD, hyperlipidemia presents with epigastric abdominal pain, nausea and vomiting.. Began around 3AM, preceding this took viagra 100mg , a bottle of wine.  6 episodes of red-brown emesis since 3AM. No emesis since 6AM today. Urinating normally, stooling normally, Denies headache. Reports hiccups.   Past Medical History:  Diagnosis Date  . Coronary artery disease    a. 2008 s/p MI [facility], [place]) with stenting of the OM1;  b. 07/2013 Cath/PCI: LM nl, LAD 80p (3.0x18 Xience DES), 28m, D1 95ost, LCX 30-51m, OM1 50-60p ISR, OM2 99ost, 80-90p/m (2.25x32 Promus DES), OM3 60ost, RCA 30ost, EF 45-50 basal-mid inf HK.  . Gout   . Hyperlipidemia     Patient Active Problem List   Diagnosis Date Noted  . Unstable angina ([place]) 07/17/2013  . Hyperlipidemia   . Coronary artery disease   . Angina of effort ([place]) 07/15/2013    Past Surgical History:  Procedure Laterality Date  . CATARACT EXTRACTION W/ INTRAOCULAR LENS  IMPLANT, BILATERAL Bilateral 2007  . CORONARY ANGIOPLASTY WITH STENT PLACEMENT  2008   [facility], [place], 1 stent, possibly to the LAD  . LEFT HEART CATHETERIZATION WITH CORONARY ANGIOGRAM N/A 07/16/2013   Procedure: LEFT HEART CATHETERIZATION WITH CORONARY ANGIOGRAM;  Surgeon: Jolaine Artist, MD;  Location: [facility] CATH LAB;  Service: Cardiovascular;  Laterality: N/A;  . TONSILLECTOMY  ~ 1960       Family History  Problem Relation Age of Onset  . Coronary artery disease Mother   . Heart attack Father     Social History   Tobacco Use  . Smoking status: Never Smoker  . Smokeless tobacco: Never Used  Vaping Use  . Vaping Use: Never used  Substance Use Topics  . Alcohol use: Yes    Alcohol/week: 2.0  standard drinks    Types: 2 Glasses of wine per week    Comment: 07/15/2013 "1-2 glasses of wine on the weekends"  . Drug use: No    Home Medications Prior to Admission medications   Medication Sig Start Date End Date Taking? Authorizing Provider  aspirin EC 81 MG tablet Take 1 tablet (81 mg total) by mouth daily. 07/24/13   Richardson Dopp T, PA-C  atorvastatin (LIPITOR) 40 MG tablet Take 40 mg by mouth daily. 12/04/19   [provider]  esomeprazole (NEXIUM) 20 MG capsule Take 20 mg by mouth daily as needed (for heartburn).    [provider]  famotidine (PEPCID) 20 MG tablet Take 1 tablet (20 mg total) by mouth 2 (two) times daily. Patient not taking: Reported on 01/09/2020 04/02/19 06/01/19  Kathrene Alu, MD  losartan (COZAAR) 25 MG tablet Take 25 mg by mouth daily. 12/15/19   [provider]  methocarbamol (ROBAXIN) 500 MG tablet Take 1 tablet (500 mg total) by mouth every 8 (eight) hours as needed for muscle spasms. Patient not taking: Reported on 01/09/2020 07/14/16   Julianne Rice, MD  metoprolol tartrate (LOPRESSOR) 25 MG tablet Take 25 mg by mouth daily.     [provider]  nitroGLYCERIN (NITROSTAT) 0.4 MG SL tablet Place 1 tablet (0.4 mg total) under the tongue every 5 (five) minutes x 3 doses as needed for  chest pain. 07/17/13   Theora Gianotti, NP  ondansetron (ZOFRAN ODT) 4 MG disintegrating tablet Take 1 tablet (4 mg total) by mouth every 8 (eight) hours as needed for nausea or vomiting. 04/30/20   Gareth Morgan, MD  oxyCODONE-acetaminophen (PERCOCET) 5-325 MG tablet Take 1-2 tablets by mouth every 4 (four) hours as needed. Patient not taking: Reported on 01/09/2020 07/14/16   Julianne Rice, MD  pantoprazole (PROTONIX) 20 MG tablet Take 2 tablets (40 mg total) by mouth daily for 14 days. 04/30/20 05/14/20  Gareth Morgan, MD  sucralfate (CARAFATE) 1 g tablet Take 1 tablet (1 g total) by mouth 4 (four) times daily -  with meals and  at bedtime. Patient not taking: Reported on 01/09/2020 04/02/19 06/01/19  Kathrene Alu, MD    Allergies    Patient has no known allergies.  Review of Systems   Review of Systems  Constitutional: Negative for fever.  HENT: Negative for sore throat.   Eyes: Negative for visual disturbance.  Respiratory: Negative for shortness of breath.   Cardiovascular: Positive for chest pain.  Gastrointestinal: Positive for abdominal pain, nausea and vomiting. Negative for constipation and diarrhea.  Genitourinary: Negative for difficulty urinating.  Musculoskeletal: Negative for back pain and neck stiffness.  Skin: Negative for rash.  Neurological: Negative for syncope and headaches.    Physical Exam Updated Vital Signs BP 129/78   Pulse 64   Temp 97.7 F (36.5 C) (Oral)   Resp 16   Ht 5\' 10"  (1.778 m)   Wt 83.5 kg   SpO2 100%   BMI 26.40 kg/m   Physical Exam Vitals and nursing note reviewed.  Constitutional:      General: He is not in acute distress.    Appearance: He is well-developed. He is not diaphoretic.  HENT:     Head: Normocephalic and atraumatic.  Eyes:     Conjunctiva/sclera: Conjunctivae normal.  Cardiovascular:     Rate and Rhythm: Normal rate and regular rhythm.     Heart sounds: Normal heart sounds. No murmur heard.  No friction rub. No gallop.   Pulmonary:     Effort: Pulmonary effort is normal. No respiratory distress.     Breath sounds: Normal breath sounds. No wheezing or rales.  Abdominal:     General: There is no distension.     Palpations: Abdomen is soft.     Tenderness: There is abdominal tenderness (epgiastric, mild, neg murphy's, no RUQ tendneress). There is no guarding.  Musculoskeletal:     Cervical back: Normal range of motion.  Skin:    General: Skin is warm and dry.  Neurological:     Mental Status: He is alert and oriented to person, place, and time.     ED Results / Procedures / Treatments   Labs (all labs ordered are listed, but  only abnormal results are displayed) Labs Reviewed  CBC WITH DIFFERENTIAL/PLATELET - Abnormal; Notable for the following components:      Result Value   RBC 3.79 (*)    HCT 38.0 (*)    MCV 100.3 (*)    MCH 34.3 (*)    Platelets 144 (*)    Lymphs Abs 0.5 (*)    All other components within normal limits  COMPREHENSIVE METABOLIC PANEL - Abnormal; Notable for the following components:   Glucose, Bld 133 (*)    All other components within normal limits  LIPASE, BLOOD  TYPE AND SCREEN  TROPONIN I (HIGH SENSITIVITY)  TROPONIN I (HIGH SENSITIVITY)  EKG EKG Interpretation  Date/Time:  Saturday April 30 2020 09:57:13 EDT Ventricular Rate:  70 PR Interval:    QRS Duration: 101 QT Interval:  418 QTC Calculation: 451 R Axis:   43 Text Interpretation: Sinus rhythm Low voltage, precordial leads Borderline T abnormalities, anterior leads No significant change since last tracing Confirmed by Gareth Morgan [phone]) on 04/30/2020 11:27:14 AM   Radiology DG Chest 2 View  Result Date: 04/30/2020 CLINICAL DATA:  Chest pain, vomiting EXAM: CHEST - 2 VIEW COMPARISON:  None. FINDINGS: The heart size and mediastinal contours are within normal limits. Coronary artery stents are noted. Both lungs are clear. No pleural effusion or pneumothorax. No acute osseous abnormality. No free air below the diaphragms. IMPRESSION: No acute process in the chest. Electronically Signed   By: Macy Mis M.D.   On: 04/30/2020 11:02    Procedures Procedures (including critical care time)  Medications Ordered in ED Medications  sodium chloride 0.9 % bolus 1,000 mL (0 mLs Intravenous Stopped 04/30/20 1213)  ondansetron (ZOFRAN) injection 4 mg (4 mg Intravenous Given 04/30/20 1019)  fentaNYL (SUBLIMAZE) injection 100 mcg (100 mcg Intravenous Given 04/30/20 1019)  ondansetron (ZOFRAN) injection 4 mg (4 mg Intravenous Given 04/30/20 1213)  alum & mag hydroxide-simeth (MAALOX/MYLANTA) 200-200-20 MG/5ML suspension 30  mL (30 mLs Oral Given 04/30/20 1213)    And  lidocaine (XYLOCAINE) 2 % viscous mouth solution 15 mL (15 mLs Oral Given 04/30/20 1214)    ED Course  I have reviewed the triage vital signs and the nursing notes.  Pertinent labs & imaging results that were available during my care of the patient were reviewed by me and considered in my medical decision making (see chart for details).    MDM Rules/Calculators/A&P                          69yo male with history of CAD, hyperlipidemia presents with concern for epigastric pain, nausea and vomiting in the setting of taking Viagra and drinking a bottle of wine last night.  Differential diagnosis for epigastric and chest pain with nausea vomiting includes Boerhaave's, ACS, gastritis, pancreatitis.  Patient hemodynamically stable several hours after beginning to have symptoms, no fevers, no signs of sepsis, no pleural effusion on x-ray or crepitus on exam and have low suspicion for esophageal rupture.  EKG without acute changes and troponin negative x2 and have low suspicion for ACS.  No sign of hepatitis or pancreatitis by labs.  Exam is not consistent with cholecystitis.  Denies having black or bloody stools, has stable hemoglobin, and reports drinking red wine prior to episode of red emesis and have low suspicion for clinically significant GI bleed.  Suspect likely gastritis secondary to alcohol use as etiology of symptoms.  He is improved with hydration, nausea medicine in the ED and tolerating po. Given rx for PPI. Patient discharged in stable condition with understanding of reasons to return.    Final Clinical Impression(s) / ED Diagnoses Final diagnoses:  Epigastric pain  Non-intractable vomiting with nausea, unspecified vomiting type    Rx / DC Orders ED Discharge Orders         Ordered    pantoprazole (PROTONIX) 20 MG tablet  Daily        04/30/20 1335    ondansetron (ZOFRAN ODT) 4 MG disintegrating tablet  Every 8 hours PRN        04/30/20  1335  Gareth Morgan, MD 05/01/20 [id number]

## 2020-08-24 ENCOUNTER — Other Ambulatory Visit (HOSPITAL_COMMUNITY): Payer: Self-pay | Admitting: Family

## 2020-08-24 DIAGNOSIS — I509 Heart failure, unspecified: Secondary | ICD-10-CM

## 2020-09-14 ENCOUNTER — Ambulatory Visit (HOSPITAL_COMMUNITY): Payer: Medicare Other | Attending: Cardiology

## 2020-09-14 ENCOUNTER — Other Ambulatory Visit: Payer: Self-pay

## 2020-09-14 DIAGNOSIS — I2510 Atherosclerotic heart disease of native coronary artery without angina pectoris: Secondary | ICD-10-CM

## 2020-09-14 DIAGNOSIS — I509 Heart failure, unspecified: Secondary | ICD-10-CM | POA: Diagnosis not present

## 2020-09-14 LAB — ECHOCARDIOGRAM COMPLETE
Area-P 1/2: 2.83 cm2
S' Lateral: 3.1 cm

## 2021-05-07 ENCOUNTER — Other Ambulatory Visit: Payer: Self-pay

## 2021-05-07 ENCOUNTER — Encounter (HOSPITAL_COMMUNITY): Payer: Self-pay | Admitting: Emergency Medicine

## 2021-05-07 ENCOUNTER — Emergency Department (HOSPITAL_COMMUNITY)
Admission: EM | Admit: 2021-05-07 | Discharge: 2021-05-07 | Disposition: A | Discharge status: Home / Self Care | Payer: Medicare Other | Attending: Emergency Medicine | Admitting: Emergency Medicine

## 2021-05-07 DIAGNOSIS — Z7982 Long term (current) use of aspirin: Secondary | ICD-10-CM | POA: Diagnosis not present

## 2021-05-07 DIAGNOSIS — K0889 Other specified disorders of teeth and supporting structures: Secondary | ICD-10-CM

## 2021-05-07 DIAGNOSIS — I2510 Atherosclerotic heart disease of native coronary artery without angina pectoris: Secondary | ICD-10-CM | POA: Diagnosis not present

## 2021-05-07 DIAGNOSIS — R519 Headache, unspecified: Secondary | ICD-10-CM | POA: Insufficient documentation

## 2021-05-07 MED ORDER — PENICILLIN V POTASSIUM 500 MG PO TABS
500.0000 mg | ORAL_TABLET | Freq: Once | ORAL | Status: AC
  Administered 2021-05-07: 500 mg via ORAL
  Filled 2021-05-07: qty 1

## 2021-05-07 MED ORDER — ACETAMINOPHEN 500 MG PO TABS
1000.0000 mg | ORAL_TABLET | Freq: Once | ORAL | Status: AC
  Administered 2021-05-07: 1000 mg via ORAL
  Filled 2021-05-07: qty 2

## 2021-05-07 MED ORDER — PENICILLIN V POTASSIUM 500 MG PO TABS: 500.0000 mg | ORAL_TABLET | Freq: Four times a day (QID) | ORAL | 0 refills | Status: AC

## 2021-05-07 NOTE — ED Triage Notes (Signed)
Patient c/o left lower dental pain x3 days. States he has not been to the dentist in a while. Has not taken anything for pain PTA.

## 2021-05-07 NOTE — Discharge Instructions (Addendum)
Please take entire course of antibiotics as directed.  For pain use Tylenol 1000 mg every 6 hours, you can also use over-the-counter Orajel which you can apply to the area using a Q-tip.  You will need to follow-up with a dentist for continued management of this.  Return to the emergency department for fevers, swelling or pain under the tongue or in the neck, difficulty breathing or swallowing or any other new or concerning symptoms.

## 2021-05-07 NOTE — ED Provider Notes (Signed)
[facility] DEPT Provider Note   CSN: [id number] Arrival date & time: 05/07/21  1623     History Chief Complaint  Patient presents with   Dental Pain    Adrian [name]. is a 70 y.o. male.  Adrian [name]. is a 70 y.o. male with a history of CAD, gout, hyperlipidemia, who presents to the emergency department for evaluation of dental pain.  Pain over the left lower gums for the past 3 days, has not had any drainage from this area.  No pain under the tongue, some pain with chewing but no difficulty swallowing.  No fevers or chills.  No nausea or vomiting.  No facial swelling.  He does report associated headache which she has history of.  He has not taken any medicine over the past 3 days to treat pain prior to arrival.  The history is provided by the patient.      Past Medical History:  Diagnosis Date   Coronary artery disease    a. 2008 s/p MI ([place], [place]) with stenting of the OM1;  b. 07/2013 Cath/PCI: LM nl, LAD 80p (3.0x18 Xience DES), 52m D1 95ost, LCX 30-417mOM1 50-60p ISR, OM2 99ost, 80-90p/m (2.25x32 Promus DES), OM3 60ost, RCA 30ost, EF 45-50 basal-mid inf HK.   Gout    Hyperlipidemia     Patient Active Problem List   Diagnosis Date Noted   [place]   Hyperlipidemia    Coronary artery disease    [place]    Past Surgical History:  Procedure Laterality Date   CATARACT EXTRACTION W/ INTRAOCULAR LENS  IMPLANT, BILATERAL Bilateral 2007   CORONARY ANGIOPLASTY WITH STENT PLACEMENT  [id number], possibly to the [place]   LEFT HEART CATHETERIZATION WITH CORONARY ANGIOGRAM N/A 07/16/2013   Procedure: LEFT HEART CATHETERIZATION WITH CORONARY ANGIOGRAM;  Surgeon: DaJolaine ArtistMD;  [facility];  Service: Cardiovascular;  Laterality: N/A;   TONSILLECTOMY  ~ 1960       Family History  Problem Relation Age of Onset   Coronary artery disease  Mother    Heart attack Father     Social History   Tobacco Use   Smoking status: Never   Smokeless tobacco: Never  Vaping Use   Vaping Use: Never used  Substance Use Topics   Alcohol use: Yes    Alcohol/week: 2.0 standard drinks    Types: 2 Glasses of wine per week    Comment: 07/15/2013 "1-2 glasses of wine on the weekends"   Drug use: No    Home Medications Prior to Admission medications   Medication Sig Start Date End Date Taking? Authorizing Provider  penicillin v potassium (VEETID) 500 MG tablet Take 1 tablet (500 mg total) by mouth 4 (four) times daily for 7 days. 05/07/21 05/14/21 Yes FoJacqlyn LarsenPA-C  aspirin EC 81 MG tablet Take 1 tablet (81 mg total) by mouth daily. 07/24/13   WeRichardson Dopp, PA-C  atorvastatin (LIPITOR) 40 MG tablet Take 40 mg by mouth daily. 12/04/19   [provider]  esomeprazole (NEXIUM) 20 MG capsule Take 20 mg by mouth daily as needed (for heartburn).    [provider]  famotidine (PEPCID) 20 MG tablet Take 1 tablet (20 mg total) by mouth 2 (two) times daily. Patient not taking: Reported on 01/09/2020 04/02/19 06/01/19  WiKathrene AluMD  losartan (COZAAR) 25 MG tablet Take 25 mg  by mouth daily. 12/15/19   [provider]  methocarbamol (ROBAXIN) 500 MG tablet Take 1 tablet (500 mg total) by mouth every 8 (eight) hours as needed for muscle spasms. Patient not taking: Reported on 01/09/2020 07/14/16   Julianne Rice, MD  metoprolol tartrate (LOPRESSOR) 25 MG tablet Take 25 mg by mouth daily.     [provider]  nitroGLYCERIN (NITROSTAT) 0.4 MG SL tablet Place 1 tablet (0.4 mg total) under the tongue every 5 (five) minutes x 3 doses as needed for chest pain. 07/17/13   Theora Gianotti, NP  ondansetron (ZOFRAN ODT) 4 MG disintegrating tablet Take 1 tablet (4 mg total) by mouth every 8 (eight) hours as needed for nausea or vomiting. 04/30/20   Gareth Morgan, MD  oxyCODONE-acetaminophen (PERCOCET) 5-325 MG  tablet Take 1-2 tablets by mouth every 4 (four) hours as needed. Patient not taking: Reported on 01/09/2020 07/14/16   Julianne Rice, MD  pantoprazole (PROTONIX) 20 MG tablet Take 2 tablets (40 mg total) by mouth daily for 14 days. 04/30/20 05/14/20  Gareth Morgan, MD  sucralfate (CARAFATE) 1 g tablet Take 1 tablet (1 g total) by mouth 4 (four) times daily -  with meals and at bedtime. Patient not taking: Reported on 01/09/2020 04/02/19 06/01/19  Kathrene Alu, MD    Allergies    Patient has no known allergies.  Review of Systems   Review of Systems  Constitutional:  Negative for chills and fever.  HENT:  Positive for dental problem. Negative for facial swelling.   Gastrointestinal:  Negative for nausea and vomiting.  Skin:  Negative for color change.  Neurological:  Positive for headaches.  All other systems reviewed and are negative.  Physical Exam Updated Vital Signs BP (!) 141/80   Pulse 65   Temp 98.3 F (36.8 C) (Oral)   Resp 18   SpO2 99%   Physical Exam Vitals and nursing note reviewed.  Constitutional:      General: He is not in acute distress.    Appearance: Normal appearance. He is well-developed and normal weight. He is not ill-appearing or diaphoretic.  HENT:     Head: Normocephalic and atraumatic.     Mouth/Throat:     Comments: Tenderness to palpation with slight swelling over the left lower gums, no obvious drainable abscess or expressible drainage.  No sublingual tenderness or induration, posterior oropharynx is clear, normal phonation, tolerating secretions, no stridor Eyes:     General:        Right eye: No discharge.        Left eye: No discharge.  Cardiovascular:     Rate and Rhythm: Normal rate.  Pulmonary:     Effort: Pulmonary effort is normal. No respiratory distress.  Musculoskeletal:     Cervical back: Neck supple. No rigidity or tenderness.  Skin:    General: Skin is warm and dry.  Neurological:     Mental Status: He is alert and  oriented to person, place, and time.     Coordination: Coordination normal.  Psychiatric:        Mood and Affect: Mood normal.        Behavior: Behavior normal.    ED Results / Procedures / Treatments   Labs (all labs ordered are listed, but only abnormal results are displayed) Labs Reviewed - No data to display  EKG None  Radiology No results found.  Procedures Procedures   Medications Ordered in ED Medications  acetaminophen (TYLENOL) tablet 1,000 mg (1,000  mg Oral Given 05/07/21 1856)  penicillin v potassium (VEETID) tablet 500 mg (500 mg Oral Given 05/07/21 1857)    ED Course  I have reviewed the triage vital signs and the nursing notes.  Pertinent labs & imaging results that were available during my care of the patient were reviewed by me and considered in my medical decision making (see chart for details).    MDM Rules/Calculators/A&P                           Patient with toothache.  No gross abscess.  Exam unconcerning for Ludwig's angina or spread of infection.  Will treat with penicillin and anti-inflammatories medicine.  Urged patient to follow-up with dentist.    Final Clinical Impression(s) / ED Diagnoses Final diagnoses:  Pain, dental    Rx / DC Orders ED Discharge Orders          Ordered    penicillin v potassium (VEETID) 500 MG tablet  4 times daily        05/07/21 1855             Jacqlyn Larsen, PA-C 05/07/21 1920    Tegeler, Gwenyth Allegra, MD 05/08/21 0010

## 2021-08-06 ENCOUNTER — Emergency Department (HOSPITAL_COMMUNITY)
Admission: EM | Admit: 2021-08-06 | Discharge: 2021-08-06 | Disposition: A | Discharge status: Home / Self Care | Payer: Medicare Other | Attending: Emergency Medicine | Admitting: Emergency Medicine

## 2021-08-06 ENCOUNTER — Encounter (HOSPITAL_COMMUNITY): Payer: Self-pay

## 2021-08-06 ENCOUNTER — Other Ambulatory Visit: Payer: Self-pay

## 2021-08-06 DIAGNOSIS — Z5321 Procedure and treatment not carried out due to patient leaving prior to being seen by health care provider: Secondary | ICD-10-CM | POA: Insufficient documentation

## 2021-08-06 DIAGNOSIS — R1110 Vomiting, unspecified: Secondary | ICD-10-CM | POA: Insufficient documentation

## 2021-08-06 DIAGNOSIS — R066 Hiccough: Secondary | ICD-10-CM | POA: Diagnosis present

## 2021-08-06 NOTE — ED Triage Notes (Signed)
Patient has had the hiccups on and off for a week. Patient did vomit 1 time a few days ago. He tried to drink baking soda. Patient has acid reflux.

## 2021-11-05 ENCOUNTER — Encounter (HOSPITAL_COMMUNITY): Payer: Self-pay | Admitting: Emergency Medicine

## 2021-11-05 ENCOUNTER — Emergency Department (HOSPITAL_COMMUNITY)
Admission: EM | Admit: 2021-11-05 | Discharge: 2021-11-05 | Disposition: A | Discharge status: Home / Self Care | Payer: Medicare Other | Attending: Emergency Medicine | Admitting: Emergency Medicine

## 2021-11-05 ENCOUNTER — Emergency Department (HOSPITAL_COMMUNITY): Payer: Medicare Other

## 2021-11-05 ENCOUNTER — Other Ambulatory Visit: Payer: Self-pay

## 2021-11-05 DIAGNOSIS — M25462 Effusion, left knee: Secondary | ICD-10-CM

## 2021-11-05 DIAGNOSIS — Z7982 Long term (current) use of aspirin: Secondary | ICD-10-CM | POA: Insufficient documentation

## 2021-11-05 DIAGNOSIS — W108XXA Fall (on) (from) other stairs and steps, initial encounter: Secondary | ICD-10-CM | POA: Insufficient documentation

## 2021-11-05 DIAGNOSIS — S8992XA Unspecified injury of left lower leg, initial encounter: Secondary | ICD-10-CM | POA: Diagnosis not present

## 2021-11-05 DIAGNOSIS — Z79899 Other long term (current) drug therapy: Secondary | ICD-10-CM | POA: Insufficient documentation

## 2021-11-05 MED ORDER — OXYCODONE-ACETAMINOPHEN 5-325 MG PO TABS: 1.0000 | ORAL_TABLET | Freq: Three times a day (TID) | ORAL | 0 refills | Status: DC | PRN

## 2021-11-05 MED ORDER — IBUPROFEN 800 MG PO TABS
800.0000 mg | ORAL_TABLET | Freq: Once | ORAL | Status: AC
  Administered 2021-11-05: 800 mg via ORAL
  Filled 2021-11-05: qty 1

## 2021-11-05 MED ORDER — OXYCODONE-ACETAMINOPHEN 5-325 MG PO TABS: 1.0000 | ORAL_TABLET | Freq: Three times a day (TID) | ORAL | 0 refills | Status: AC | PRN

## 2021-11-05 NOTE — ED Notes (Signed)
I provided reinforced discharge education based off of discharge instructions. Pt acknowledged and understood my education. Pt had no further questions/concerns for provider/myself.  °

## 2021-11-05 NOTE — ED Triage Notes (Signed)
Pt reports left knee pain after tripping up the stairs last night. Pt has swelling of the knee. ?

## 2021-11-05 NOTE — ED Provider Notes (Signed)
?[facility] DEPT ?Provider Note ? ? ?CSN: [id number] ?Arrival date & time: 11/05/21  0847 ? ?  ? ?History ? ?Chief Complaint  ?Patient presents with  ? Knee Pain  ? ? ?Adrian [name]. is a 71 y.o. male. ? ?The history is provided by the patient. No language interpreter was used.  ?Knee Pain ?Associated symptoms: no fever   ? ?71 year old male with significant history of gout, presenting complaining of left knee injury.  Patient reports 2 days ago he was carrying a bunch of tiles on his hand while walking up the steps, may have missed stepped and fell striking his head as well as twisted his left knee.  He denies any loss of consciousness.  He was able to ambulate afterward but since then has noticed quite a bit of pain about the left knee.  Pain is sharp throbbing moderate intensity worse with movement.  He also noted associated swelling.  He denies any numbness denies any hip or ankle pain and denies any weakness.  He has been using his cane as well as using a knee sleeves without adequate support.  He is requesting for better pain management.  He does not have an orthopedist.  Denies any precipitating symptoms prior to the fall. ? ?Home Medications ?Prior to Admission medications   ?Medication Sig Start Date End Date Taking? Authorizing Provider  ?aspirin EC 81 MG tablet Take 1 tablet (81 mg total) by mouth daily. 07/24/13   Richardson Dopp T, PA-C  ?atorvastatin (LIPITOR) 40 MG tablet Take 40 mg by mouth daily. 12/04/19   [provider]  ?esomeprazole (NEXIUM) 20 MG capsule Take 20 mg by mouth daily as needed (for heartburn).    [provider]  ?famotidine (PEPCID) 20 MG tablet Take 1 tablet (20 mg total) by mouth 2 (two) times daily. ?Patient not taking: Reported on 01/09/2020 04/02/19 06/01/19  Kathrene Alu, MD  ?losartan (COZAAR) 25 MG tablet Take 25 mg by mouth daily. 12/15/19   [provider]  ?methocarbamol (ROBAXIN) 500 MG tablet Take 1 tablet (500 mg  total) by mouth every 8 (eight) hours as needed for muscle spasms. ?Patient not taking: Reported on 01/09/2020 07/14/16   Julianne Rice, MD  ?metoprolol tartrate (LOPRESSOR) 25 MG tablet Take 25 mg by mouth daily.     [provider]  ?nitroGLYCERIN (NITROSTAT) 0.4 MG SL tablet Place 1 tablet (0.4 mg total) under the tongue every 5 (five) minutes x 3 doses as needed for chest pain. 07/17/13   Theora Gianotti, NP  ?ondansetron (ZOFRAN ODT) 4 MG disintegrating tablet Take 1 tablet (4 mg total) by mouth every 8 (eight) hours as needed for nausea or vomiting. 04/30/20   Gareth Morgan, MD  ?oxyCODONE-acetaminophen (PERCOCET) 5-325 MG tablet Take 1-2 tablets by mouth every 4 (four) hours as needed. ?Patient not taking: Reported on 01/09/2020 07/14/16   Julianne Rice, MD  ?pantoprazole (PROTONIX) 20 MG tablet Take 2 tablets (40 mg total) by mouth daily for 14 days. 04/30/20 05/14/20  Gareth Morgan, MD  ?sucralfate (CARAFATE) 1 g tablet Take 1 tablet (1 g total) by mouth 4 (four) times daily -  with meals and at bedtime. ?Patient not taking: Reported on 01/09/2020 04/02/19 06/01/19  Kathrene Alu, MD  ?   ? ?Allergies    ?Patient has no known allergies.   ? ?Review of Systems   ?Review of Systems  ?Constitutional:  Negative for fever.  ?Musculoskeletal:  Positive for arthralgias.  ? ?  Physical Exam ?Updated Vital Signs ?BP (!) 118/58 (BP Location: Right Arm)   Pulse 71   Temp 97.6 ?F (36.4 ?C) (Oral)   Resp 18   SpO2 100%  ?Physical Exam ?Vitals and nursing note reviewed.  ?Constitutional:   ?   General: He is not in acute distress. ?   Appearance: He is well-developed.  ?HENT:  ?   Head: Atraumatic.  ?Eyes:  ?   Conjunctiva/sclera: Conjunctivae normal.  ?Musculoskeletal:     ?   General: Tenderness (Left knee: Moderate edema noted to the anterior knee most notable at the suprapatellar region with tenderness to palpation.  No obvious joint laxity.  Patella is located.  Increasing pain with flexion  and extension) present.  ?   Cervical back: Neck supple.  ?   Comments: Left hip and left ankle nontender.  ?Skin: ?   Findings: No rash.  ?Neurological:  ?   Mental Status: He is alert.  ? ? ?ED Results / Procedures / Treatments   ?Labs ?(all labs ordered are listed, but only abnormal results are displayed) ?Labs Reviewed - No data to display ? ?EKG ?None ? ?Radiology ?DG Knee Complete 4 Views Left ? ?Result Date: 11/05/2021 ?CLINICAL DATA:  Status post fall, knee pain EXAM: LEFT KNEE - COMPLETE 4+ VIEW COMPARISON:  None. FINDINGS: No acute fracture or dislocation. No aggressive osseous lesion. Normal alignment. Generalized osteopenia. Severe osteoarthritis of the medial femorotibial compartment. Mild osteoarthritis of the lateral femorotibial compartment mild osteoarthritis of the patellofemoral compartment. Tricompartmental marginal osteophytes. Multiple loose bodies along the posterior aspect of the knee. Large joint effusion. Soft tissue are unremarkable. No radiopaque foreign body or soft tissue emphysema. Peripheral vascular atherosclerotic disease. IMPRESSION: 1. No acute osseous injury of the left knee. 2. Tricompartmental osteoarthritis of the left knee. Electronically Signed   By: Kathreen Devoid M.D.   On: 11/05/2021 09:25   ? ?Procedures ?Procedures  ? ? ?Medications Ordered in ED ?Medications - No data to display ? ?ED Course/ Medical Decision Making/ A&P ?  ?                        ?Medical Decision Making ?Problems Addressed: ?Effusion of knee joint, left: acute illness or injury ?   Details: -xray obtained and negative for fx/dislocation ?-pt has knee sleeve and cane to support ?-opiate pain medication given ?-ortho referral given ? ?Amount and/or Complexity of Data Reviewed ?Radiology: ordered and independent interpretation performed. Decision-making details documented in ED Course. ? ?Risk ?Prescription drug management. ? ? ?BP (!) 118/58 (BP Location: Right Arm)   Pulse 71   Temp 97.6 ?F (36.4 ?C)  (Oral)   Resp 18   SpO2 100%  ?9:36 AM ? ?This is a 71 year old male who had a mechanical fall 2 days ago and twisted his left knee in the process.  He is here with complaint of left knee pain and swelling.  On exam, he does have moderate edema and tenderness about the left knee without any obvious deformity.  He is able to ambulate using his cane.  He does not have pain to his hip or his ankle. ? ?X-ray obtained and independently visualized and interpreted by me.  It appears no obvious fracture or dislocation noted however there significant signs of osteoarthritis about the left knee.  Moderate amount of joint effusion was also noted as well. ? ?I discussed this finding with patient, recommend continue with using his cane, RICE therapy, knee  sleeve for support however he may benefit from outpatient follow-up with orthopedist as this could be an internal derangement of the knee such as a ligamentous injury or meniscal tear.  Patient voiced understanding and agrees with plan.  I will prescribe patient a short course of opiate pain medication as over-the-counter medication has not provided adequate relief. ? ? ? ? ? ? ? ?Final Clinical Impression(s) / ED Diagnoses ?Final diagnoses:  ?Effusion of knee joint, left  ? ? ?Rx / DC Orders ?ED Discharge Orders   ? ?      Ordered  ?  oxyCODONE-acetaminophen (PERCOCET) 5-325 MG tablet  Every 8 hours PRN,   Status:  Discontinued       ? 11/05/21 0944  ?  oxyCODONE-acetaminophen (PERCOCET) 5-325 MG tablet  Every 8 hours PRN       ? 11/05/21 0945  ? ?  ?  ? ?  ? ? ?  ?Domenic Moras, PA-C ?11/05/21 1000 ? ?  ?Lajean Saver, MD ?11/05/21 1311 ? ?

## 2021-11-05 NOTE — Discharge Instructions (Addendum)
You have been evaluated for your left knee injury.  Fortunately x-ray did not show any broken bone or any dislocation however you do have quite a bit of swelling to your knee.  Please continue to wear knee sleeves, use your cane to ambulate, keep your knee elevated at rest, and take opiate pain medication as needed for pain control.  Follow-up with orthopedist in a week if you notice no improvement.  Be aware that opiate pain medication can cause drowsiness and increase the risk of falling ?

## 2022-05-02 ENCOUNTER — Other Ambulatory Visit: Payer: Self-pay | Admitting: Family

## 2022-05-02 ENCOUNTER — Ambulatory Visit
Admission: RE | Admit: 2022-05-02 | Discharge: 2022-05-02 | Disposition: A | Discharge status: Home / Self Care | Payer: Medicare Other | Source: Ambulatory Visit | Attending: Family | Admitting: Family

## 2022-05-02 DIAGNOSIS — M25552 Pain in left hip: Secondary | ICD-10-CM

## 2022-05-02 DIAGNOSIS — M5440 Lumbago with sciatica, unspecified side: Secondary | ICD-10-CM

## 2022-10-01 ENCOUNTER — Emergency Department (HOSPITAL_COMMUNITY)
Admission: EM | Admit: 2022-10-01 | Discharge: 2022-10-02 | Discharge status: Against Medical Advice | Payer: Medicare Other | Attending: Emergency Medicine | Admitting: Emergency Medicine

## 2022-10-01 ENCOUNTER — Encounter (HOSPITAL_COMMUNITY): Payer: Self-pay

## 2022-10-01 ENCOUNTER — Emergency Department (HOSPITAL_COMMUNITY): Payer: Medicare Other

## 2022-10-01 DIAGNOSIS — R1110 Vomiting, unspecified: Secondary | ICD-10-CM | POA: Insufficient documentation

## 2022-10-01 DIAGNOSIS — M7989 Other specified soft tissue disorders: Secondary | ICD-10-CM | POA: Insufficient documentation

## 2022-10-01 DIAGNOSIS — R079 Chest pain, unspecified: Secondary | ICD-10-CM | POA: Insufficient documentation

## 2022-10-01 DIAGNOSIS — Z5321 Procedure and treatment not carried out due to patient leaving prior to being seen by health care provider: Secondary | ICD-10-CM | POA: Diagnosis not present

## 2022-10-01 DIAGNOSIS — R066 Hiccough: Secondary | ICD-10-CM | POA: Insufficient documentation

## 2022-10-01 DIAGNOSIS — I2510 Atherosclerotic heart disease of native coronary artery without angina pectoris: Secondary | ICD-10-CM | POA: Diagnosis not present

## 2022-10-01 LAB — CBC WITH DIFFERENTIAL/PLATELET
Abs Immature Granulocytes: 0.01 10*3/uL (ref 0.00–0.07)
Basophils Absolute: 0 10*3/uL (ref 0.0–0.1)
Basophils Relative: 0 %
Eosinophils Absolute: 0 10*3/uL (ref 0.0–0.5)
Eosinophils Relative: 1 %
HCT: 39.6 % (ref 39.0–52.0)
Hemoglobin: 13.3 g/dL (ref 13.0–17.0)
Immature Granulocytes: 0 %
Lymphocytes Relative: 20 %
Lymphs Abs: 0.7 10*3/uL (ref 0.7–4.0)
MCH: 33.1 pg (ref 26.0–34.0)
MCHC: 33.6 g/dL (ref 30.0–36.0)
MCV: 98.5 fL (ref 80.0–100.0)
Monocytes Absolute: 0.4 10*3/uL (ref 0.1–1.0)
Monocytes Relative: 11 %
Neutro Abs: 2.4 10*3/uL (ref 1.7–7.7)
Neutrophils Relative %: 68 %
Platelets: 105 10*3/uL — ABNORMAL LOW (ref 150–400)
RBC: 4.02 MIL/uL — ABNORMAL LOW (ref 4.22–5.81)
RDW: 12.9 % (ref 11.5–15.5)
WBC: 3.6 10*3/uL — ABNORMAL LOW (ref 4.0–10.5)
nRBC: 0 % (ref 0.0–0.2)

## 2022-10-01 LAB — COMPREHENSIVE METABOLIC PANEL
ALT: 21 U/L (ref 0–44)
AST: 27 U/L (ref 15–41)
Albumin: 3.6 g/dL (ref 3.5–5.0)
Alkaline Phosphatase: 84 U/L (ref 38–126)
Anion gap: 12 (ref 5–15)
BUN: 13 mg/dL (ref 8–23)
CO2: 28 mmol/L (ref 22–32)
Calcium: 9.1 mg/dL (ref 8.9–10.3)
Chloride: 93 mmol/L — ABNORMAL LOW (ref 98–111)
Creatinine, Ser: 1.19 mg/dL (ref 0.61–1.24)
GFR, Estimated: >60 mL/min (ref >60)
Glucose, Bld: 114 mg/dL — ABNORMAL HIGH (ref 70–99)
Potassium: 3.1 mmol/L — ABNORMAL LOW (ref 3.5–5.1)
Sodium: 133 mmol/L — ABNORMAL LOW (ref 135–145)
Total Bilirubin: 0.6 mg/dL (ref 0.3–1.2)
Total Protein: 6.9 g/dL (ref 6.5–8.1)

## 2022-10-01 LAB — TROPONIN I (HIGH SENSITIVITY): Troponin I (High Sensitivity): 3 ng/L (ref <18)

## 2022-10-01 IMAGING — CR DG KNEE COMPLETE 4+V*L*
4 series · 4 of 4 positions shown · non-contrast
Comparison: None.

CLINICAL DATA: Status post fall, knee pain

EXAM:
LEFT KNEE - COMPLETE 4+ VIEW

[t knee obl left (1 of 2)]
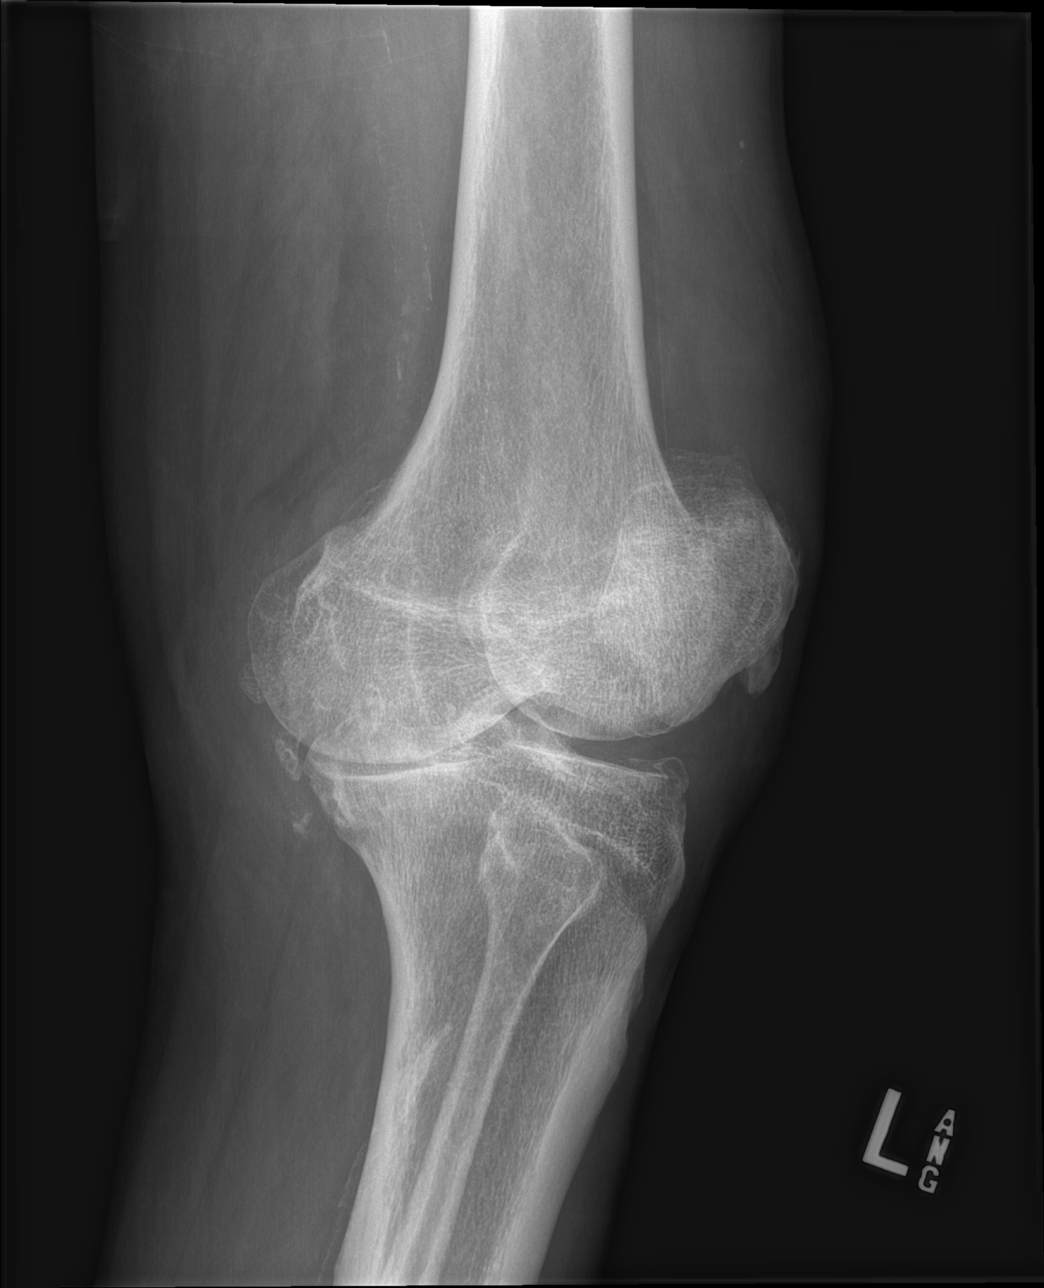

[t knee ap left]
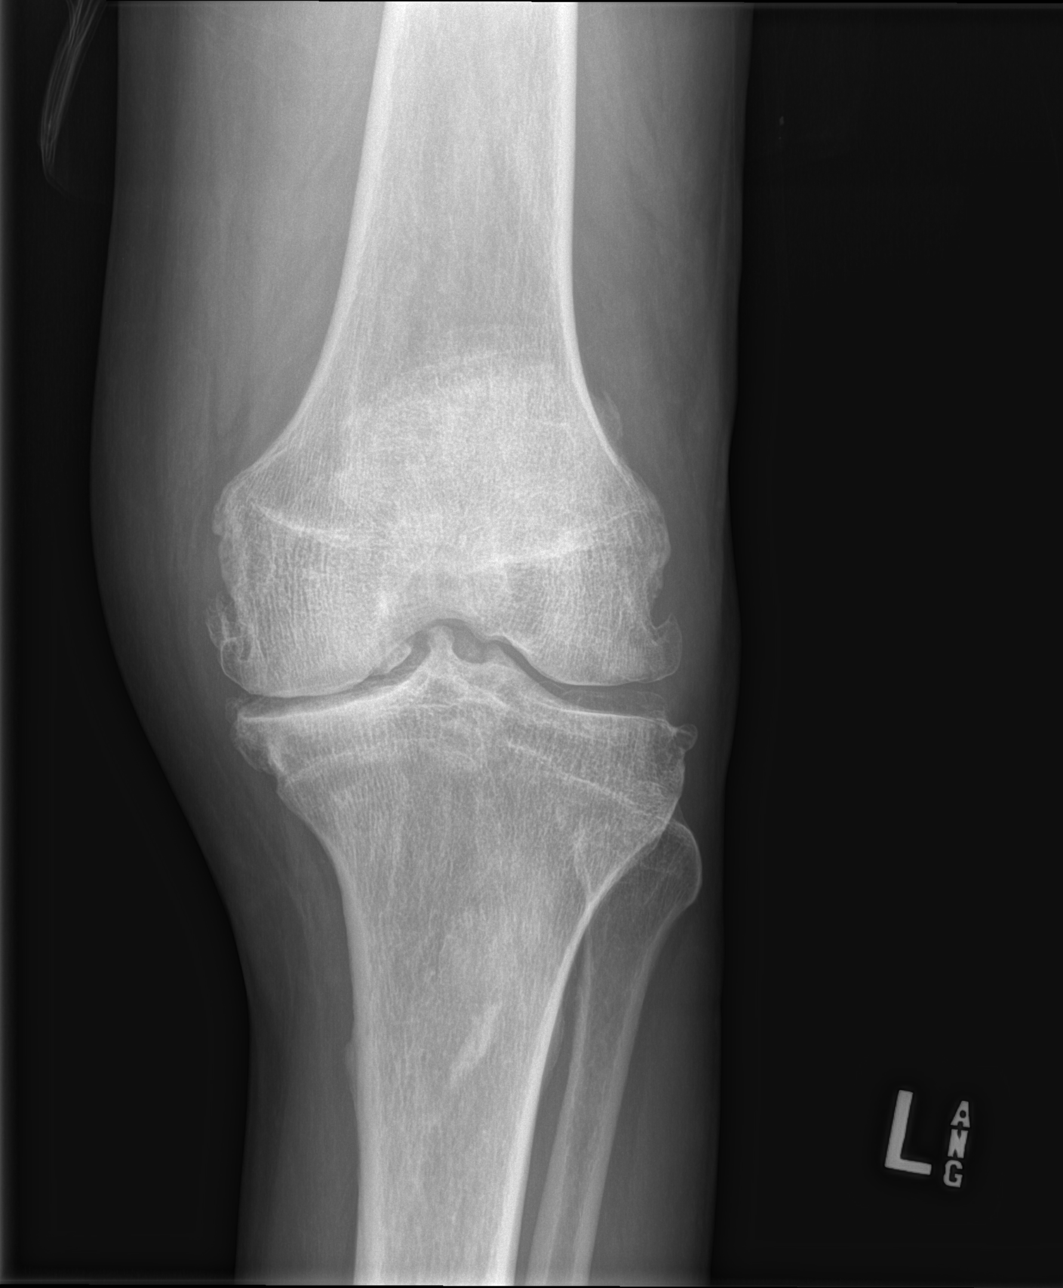

[t knee obl left (2 of 2)]
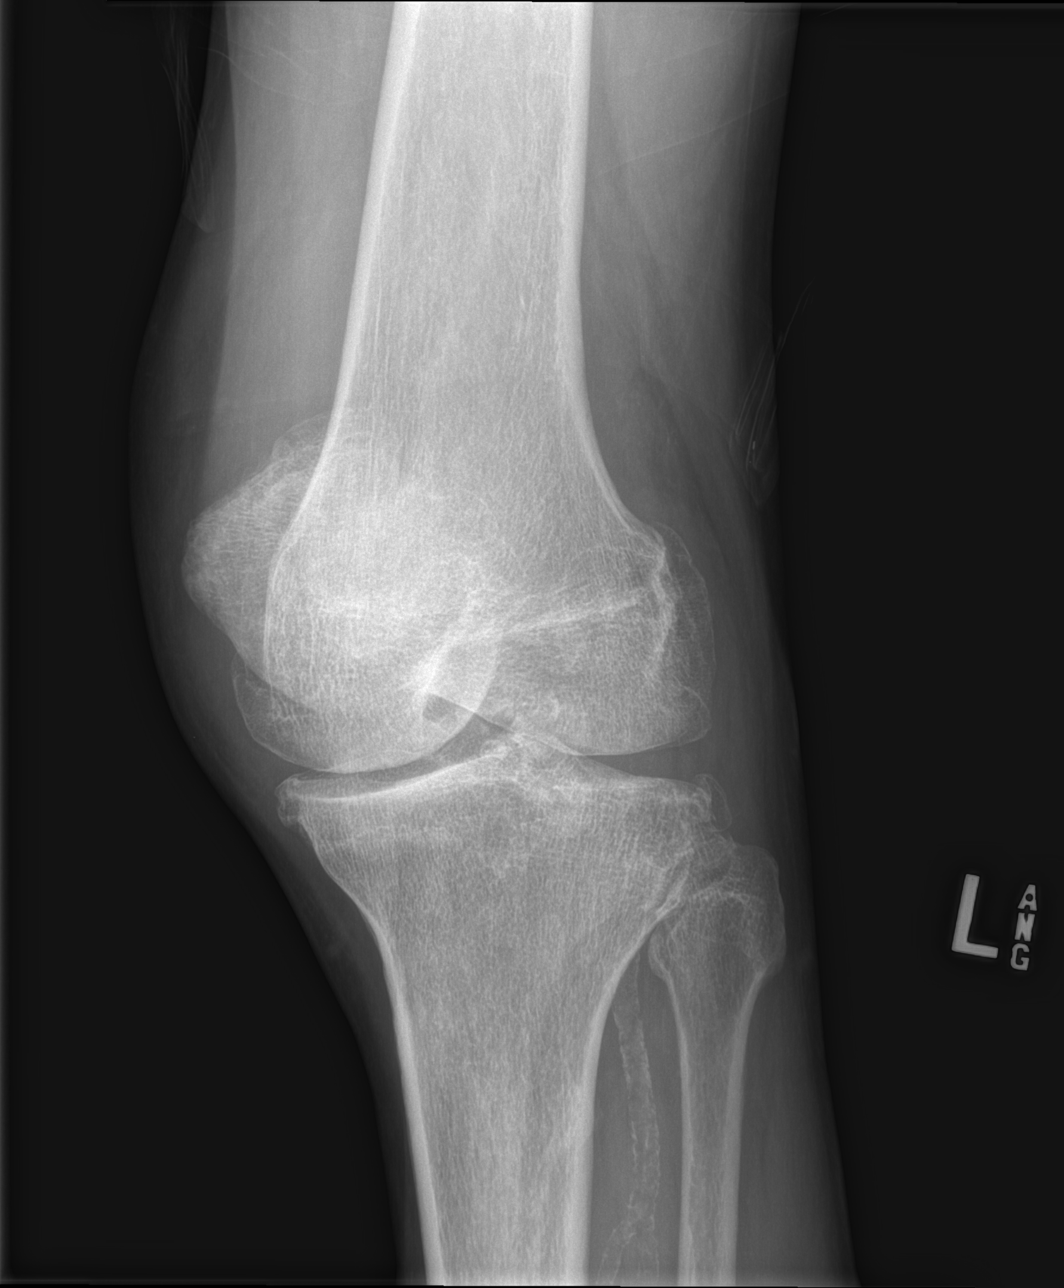

[t knee lat left]
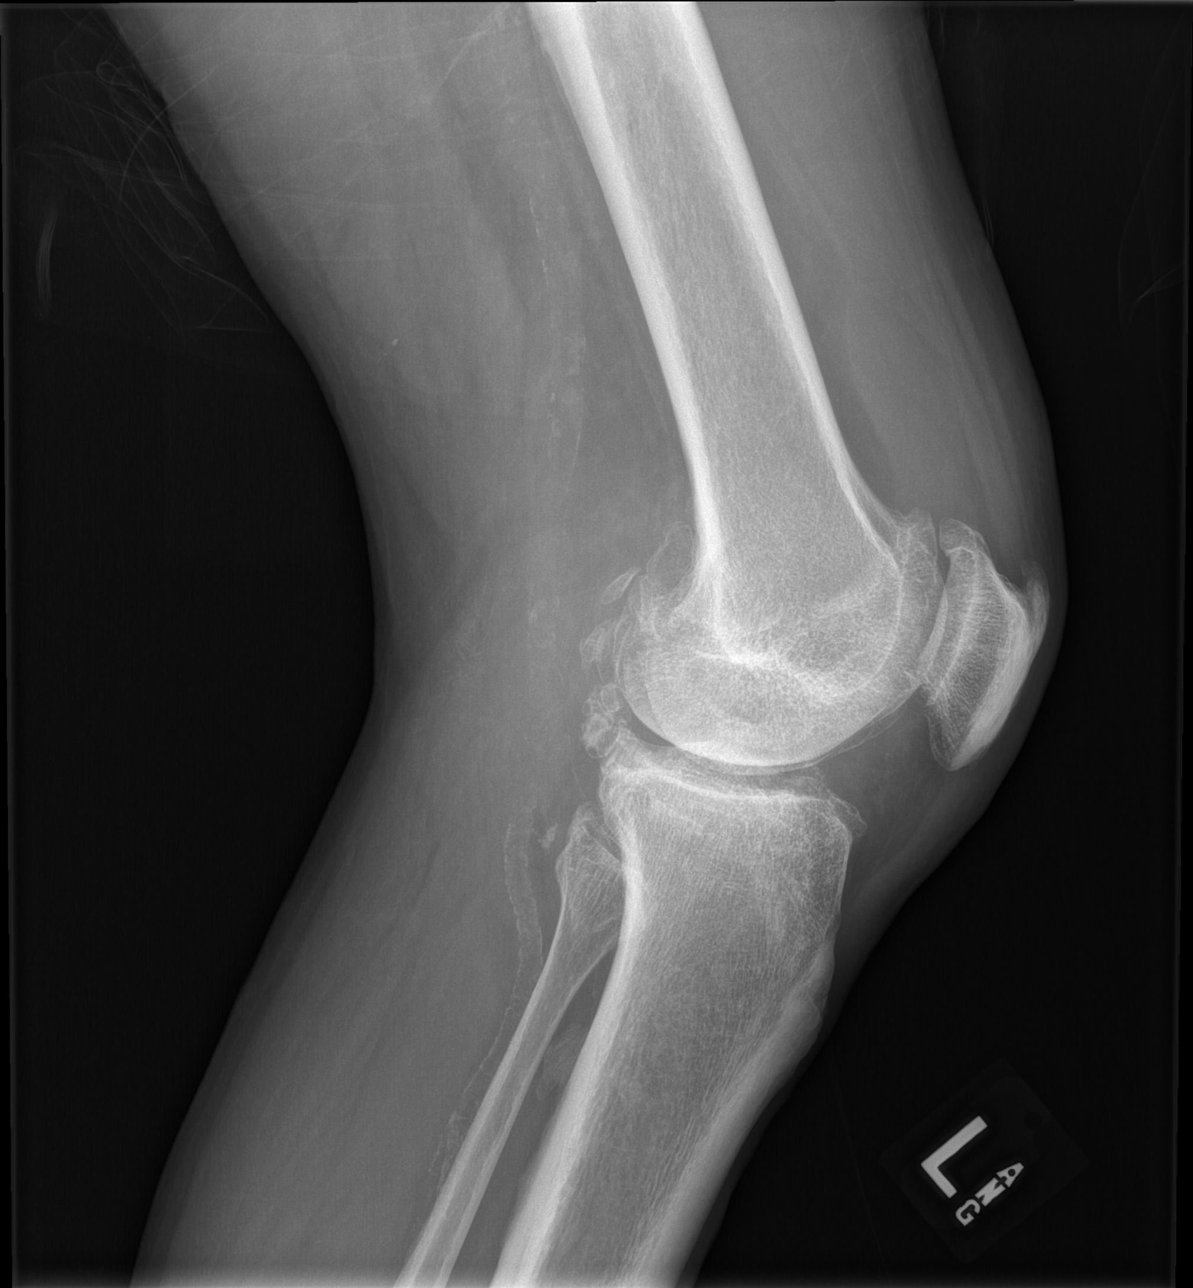

[4 of 4 positions shown; findings below may reference images not displayed]

FINDINGS: No acute fracture or dislocation. No aggressive osseous lesion.
Normal alignment. Generalized osteopenia. Severe osteoarthritis of
the medial femorotibial compartment. Mild osteoarthritis of the
lateral femorotibial compartment mild osteoarthritis of the
patellofemoral compartment. Tricompartmental marginal osteophytes.
Multiple loose bodies along the posterior aspect of the knee. Large
joint effusion.

Soft tissue are unremarkable. No radiopaque foreign body or soft
tissue emphysema. Peripheral vascular atherosclerotic disease.
IMPRESSION: 1. No acute osseous injury of the left knee.
2. Tricompartmental osteoarthritis of the left knee.

## 2022-10-01 NOTE — ED Provider Triage Note (Signed)
Emergency Medicine Provider Triage Evaluation Note  Adrian [name]. , a 72 y.o. male  was evaluated in triage.  Pt complains of chest pain with hiccups.  He has a history of coronary artery disease, MI status post stenting.  Symptoms started about 2 days ago.  He has had intense pain in the mid chest at times.  He has had hiccups which has interfered with sleep.  No shortness of breath or sweating.  He has had 1 episode of vomiting.  Patient denies risk factors for pulmonary embolism including: unilateral leg swelling, history of DVT/PE/other blood clots, use of exogenous hormones, recent immobilizations, recent surgery, recent travel (>4hr segment), malignancy, hemoptysis.    Review of Systems  Positive: Chest pain, hiccups Negative: Fever, cough  Physical Exam  BP 127/88 (BP Location: Left Arm)   Pulse 72   Temp 98.2 F (36.8 C) (Oral)   Resp 18   SpO2 100%  Gen:   Awake, no distress   Resp:  Normal effort  MSK:   Moves extremities without difficulty  Other:    Medical Decision Making  Medically screening exam initiated at 10:21 PM.  Appropriate orders placed.  Adrian [name]. was informed that the remainder of the evaluation will be completed by another provider, this initial triage assessment does not replace that evaluation, and the importance of remaining in the ED until their evaluation is complete.     Carlisle Cater, PA-C 10/01/22 2222

## 2022-10-01 NOTE — ED Triage Notes (Signed)
Pt c/o intermittent central chest pain & hiccups that started 2 days ago.

## 2022-10-03 ENCOUNTER — Emergency Department (HOSPITAL_COMMUNITY)
Admission: EM | Admit: 2022-10-03 | Discharge: 2022-10-03 | Disposition: A | Discharge status: Home / Self Care | Payer: Medicare Other | Attending: Emergency Medicine | Admitting: Emergency Medicine

## 2022-10-03 ENCOUNTER — Emergency Department (HOSPITAL_COMMUNITY): Payer: Medicare Other

## 2022-10-03 DIAGNOSIS — Z7982 Long term (current) use of aspirin: Secondary | ICD-10-CM | POA: Diagnosis not present

## 2022-10-03 DIAGNOSIS — R066 Hiccough: Secondary | ICD-10-CM | POA: Diagnosis not present

## 2022-10-03 DIAGNOSIS — R1013 Epigastric pain: Secondary | ICD-10-CM | POA: Insufficient documentation

## 2022-10-03 DIAGNOSIS — I2510 Atherosclerotic heart disease of native coronary artery without angina pectoris: Secondary | ICD-10-CM | POA: Insufficient documentation

## 2022-10-03 LAB — CBC
HCT: 40.2 % (ref 39.0–52.0)
Hemoglobin: 13.9 g/dL (ref 13.0–17.0)
MCH: 33.4 pg (ref 26.0–34.0)
MCHC: 34.6 g/dL (ref 30.0–36.0)
MCV: 96.6 fL (ref 80.0–100.0)
Platelets: 109 10*3/uL — ABNORMAL LOW (ref 150–400)
RBC: 4.16 MIL/uL — ABNORMAL LOW (ref 4.22–5.81)
RDW: 12.8 % (ref 11.5–15.5)
WBC: 3.2 10*3/uL — ABNORMAL LOW (ref 4.0–10.5)
nRBC: 0 % (ref 0.0–0.2)

## 2022-10-03 LAB — BASIC METABOLIC PANEL
Anion gap: 12 (ref 5–15)
BUN: 12 mg/dL (ref 8–23)
CO2: 26 mmol/L (ref 22–32)
Calcium: 9.5 mg/dL (ref 8.9–10.3)
Chloride: 98 mmol/L (ref 98–111)
Creatinine, Ser: 0.96 mg/dL (ref 0.61–1.24)
GFR, Estimated: >60 mL/min (ref >60)
Glucose, Bld: 92 mg/dL (ref 70–99)
Potassium: 3.5 mmol/L (ref 3.5–5.1)
Sodium: 136 mmol/L (ref 135–145)

## 2022-10-03 LAB — TROPONIN I (HIGH SENSITIVITY): Troponin I (High Sensitivity): 5 ng/L (ref <18)

## 2022-10-03 MED ORDER — BACLOFEN 5 MG PO TABS: 10.0000 mg | ORAL_TABLET | Freq: Two times a day (BID) | ORAL | 0 refills | Status: AC | PRN

## 2022-10-03 MED ORDER — ALUM & MAG HYDROXIDE-SIMETH 200-200-20 MG/5ML PO SUSP
30.0000 mL | Freq: Once | ORAL | Status: AC
  Administered 2022-10-03: 30 mL via ORAL
  Filled 2022-10-03: qty 30

## 2022-10-03 MED ORDER — OMEPRAZOLE 20 MG PO CPDR: 20.0000 mg | DELAYED_RELEASE_CAPSULE | Freq: Every day | ORAL | 0 refills | Status: AC

## 2022-10-03 NOTE — ED Notes (Signed)
C/o epigastric burning and hiccups

## 2022-10-03 NOTE — ED Provider Triage Note (Signed)
Emergency Medicine Provider Triage Evaluation Note  Adrian Adrian [name]. , Adrian [name] 72 y.o. male  was evaluated in triage.  Pt complains of hiccups.  Notes that he has Adrian [name] burning sensation to his chest.  Notes that he was Adrian [name] evaluated in the emergency department Adrian [name] couple days ago.  Was told to come back to the emergency department for risk of having Adrian [name] heart attack.  Has history of stent placement.  Denies chest pain, shortness of breath, nausea, vomiting, diaphoresis.  Review of Systems  Positive: Negative:   Physical Exam  BP (!) 129/106 (BP Location: Right Arm)   Pulse 76   Temp 98.2 F (36.8 C) (Oral)   Resp 14   SpO2 100%  Gen:   Awake, no distress   Resp:  Normal effort  MSK:   Moves extremities without difficulty  Other:  No chest wall tenderness to palpation  Medical Decision Making  Medically screening exam initiated at 12:34 PM.  Appropriate orders placed.  Adrian Adrian [name]. was informed that the remainder of the evaluation will be completed by another provider, this initial triage assessment does not replace that evaluation, and the importance of remaining in the ED until their evaluation is complete.  Workup initiated   Adrian Adrian [name] A, PA-C 10/03/22 1234

## 2022-10-03 NOTE — ED Notes (Signed)
IV started, repeat trop sent, pt now d/c'd, trop cancelled.

## 2022-10-03 NOTE — ED Provider Notes (Signed)
[facility] Provider Note   CSN: [id number] Arrival date & time: 10/03/22  1214     History  Chief Complaint  Patient presents with   Chest Pain   Hiccups    Adrian [name]. is a 72 y.o. male.   Chest Pain Patient presents with epigastric pain.  Hiccups.  Some chest pain.  Had over the last 10 days now.  Started 2 days ago in the ER but did not complete visit.  Previous coronary artery disease with stent.  States this feels different.  No vomiting but at times will spit up a little sputum.  Occasionally has gotten hiccups in the past but never this long.    Past Medical History:  Diagnosis Date   Coronary artery disease    a. 2008 s/p MI ([place], [place]) with stenting of the OM1;  b. 07/2013 Cath/PCI: LM nl, LAD 80p (3.0x18 Xience DES), 21m D1 95ost, LCX 30-417mOM1 50-60p ISR, OM2 99ost, 80-90p/m (2.25x32 Promus DES), OM3 60ost, RCA 30ost, EF 45-50 basal-mid inf HK.   Gout    Hyperlipidemia     Home Medications Prior to Admission medications   Medication Sig Start Date End Date Taking? Authorizing Provider  baclofen 5 MG TABS Take 2 tablets (10 mg total) by mouth 2 (two) times daily as needed (hiccups). 10/03/22  Yes PiDavonna BellingMD  omeprazole (PRILOSEC) 20 MG capsule Take 1 capsule (20 mg total) by mouth daily. 10/03/22  Yes PiDavonna BellingMD  aspirin EC 81 MG tablet Take 1 tablet (81 mg total) by mouth daily. 07/24/13   WeRichardson Dopp, PA-C  atorvastatin (LIPITOR) 40 MG tablet Take 40 mg by mouth daily. 12/04/19   [provider]  esomeprazole (NEXIUM) 20 MG capsule Take 20 mg by mouth daily as needed (for heartburn).    [provider]  losartan (COZAAR) 25 MG tablet Take 25 mg by mouth daily. 12/15/19   [provider]  metoprolol tartrate (LOPRESSOR) 25 MG tablet Take 25 mg by mouth daily.     [provider]  nitroGLYCERIN (NITROSTAT) 0.4 MG SL tablet Place 1 tablet (0.4 mg total)  under the tongue every 5 (five) minutes x 3 doses as needed for chest pain. 07/17/13   BeTheora GianottiNP  ondansetron (ZOFRAN ODT) 4 MG disintegrating tablet Take 1 tablet (4 mg total) by mouth every 8 (eight) hours as needed for nausea or vomiting. 04/30/20   ScGareth MorganMD  oxyCODONE-acetaminophen (PERCOCET) 5-325 MG tablet Take 1 tablet by mouth every 8 (eight) hours as needed for moderate pain or severe pain. 11/05/21   TrDomenic MorasPA-C      Allergies    Patient has no known allergies.    Review of Systems   Review of Systems  Cardiovascular:  Positive for chest pain.    Physical Exam Updated Vital Signs BP (!) 129/93   Pulse 65   Temp 98.2 F (36.8 C) (Oral)   Resp (!) 23   SpO2 100%  Physical Exam Vitals and nursing note reviewed.  Pulmonary:     Breath sounds: No wheezing or rhonchi.  Abdominal:     Tenderness: There is abdominal tenderness.     Comments: Minimal epigastric tenderness.  No rebound or guarding.  No hernia palpated.  Neurological:     Mental Status: He is alert.     ED Results / Procedures / Treatments   Labs (all labs ordered are listed, but only  abnormal results are displayed) Labs Reviewed  CBC - Abnormal; Notable for the following components:      Result Value   WBC 3.2 (*)    RBC 4.16 (*)    Platelets 109 (*)    All other components within normal limits  BASIC METABOLIC PANEL  TROPONIN I (HIGH SENSITIVITY)    EKG EKG Interpretation  Date/Time:  Wednesday October 03 2022 12:32:59 EST Ventricular Rate:  76 PR Interval:  155 QRS Duration: 128 QT Interval:  366 QTC Calculation: 412 R Axis:   69 Text Interpretation: Sinus rhythm Nonspecific intraventricular conduction delay No significant change since last tracing Confirmed by Davonna Belling [phone]) on 10/03/2022 3:03:08 PM  Radiology DG Chest Port 1 View  Result Date: 10/03/2022 CLINICAL DATA:  Intractable hiccups. EXAM: PORTABLE CHEST 1 VIEW COMPARISON:   10/01/2022 FINDINGS: The cardiac silhouette, mediastinal and hilar contours are within normal limits and stable. Coronary stents are noted. The lungs are clear of an acute process. No pleural effusions or pulmonary lesions. No pneumothorax. The bony thorax is intact. IMPRESSION: No acute cardiopulmonary findings. Electronically Signed   By: Marijo Sanes M.D.   On: 10/03/2022 12:44   DG Chest 2 View  Result Date: 10/01/2022 CLINICAL DATA:  Chest pain, hiccups for 2 days. EXAM: CHEST - 2 VIEW COMPARISON:  04/30/2020 FINDINGS: The cardiomediastinal contours are normal. Coronary stents are visualized. The lungs are clear. Pulmonary vasculature is normal. No consolidation, pleural effusion, or pneumothorax. Thoracic spondylosis. No acute osseous abnormalities are seen. IMPRESSION: No acute chest findings. Electronically Signed   By: Keith Rake M.D.   On: 10/01/2022 22:43    Procedures Procedures    Medications Ordered in ED Medications  alum & mag hydroxide-simeth (MAALOX/MYLANTA) 200-200-20 MG/5ML suspension 30 mL (has no administration in time range)    ED Course/ Medical Decision Making/ A&P                             Medical Decision Making Risk OTC drugs. Prescription drug management.  Patient with hiccups.  Has had for around 10 days.  Reportedly had been called and told to come back.  I do not see notes of this however.  Has troponin negative both today and 2 days ago.  Lab work reassuring.  Had initial hypokalemia 2 days ago that is improved.  CMP overall reassuring 2 days ago and basic metabolic reassuring today.  CBC does show a mild low WBC and platelets but these appear to be somewhat chronic.  Doubt cardiac cause of it.  Benign x-ray benign abdominal exam.  Will treat symptomatically with Prilosec and baclofen.  Outpatient follow-up with PCP as needed.  Appears stable for discharge.        Final Clinical Impression(s) / ED Diagnoses Final diagnoses:  Hiccups    Rx  / DC Orders ED Discharge Orders          Ordered    omeprazole (PRILOSEC) 20 MG capsule  Daily        10/03/22 1521    baclofen 5 MG TABS  2 times daily PRN        10/03/22 1521              Davonna Belling, MD 10/03/22 1540

## 2022-10-03 NOTE — ED Triage Notes (Signed)
Pt states he was called by someone in the hospital telling him to come back to be seen for his continued hiccups and chest "burning". Pt denies any SOB, N/V, diaphoresis.

## 2022-11-15 ENCOUNTER — Emergency Department (HOSPITAL_COMMUNITY)
Admission: EM | Admit: 2022-11-15 | Discharge: 2022-11-15 | Discharge status: Against Medical Advice | Payer: Medicare Other | Attending: Emergency Medicine | Admitting: Emergency Medicine

## 2022-11-15 ENCOUNTER — Encounter (HOSPITAL_COMMUNITY): Payer: Self-pay

## 2022-11-15 DIAGNOSIS — Z5321 Procedure and treatment not carried out due to patient leaving prior to being seen by health care provider: Secondary | ICD-10-CM | POA: Diagnosis not present

## 2022-11-15 DIAGNOSIS — R066 Hiccough: Secondary | ICD-10-CM | POA: Diagnosis not present

## 2022-11-15 DIAGNOSIS — I2510 Atherosclerotic heart disease of native coronary artery without angina pectoris: Secondary | ICD-10-CM | POA: Diagnosis not present

## 2022-11-15 DIAGNOSIS — R1110 Vomiting, unspecified: Secondary | ICD-10-CM | POA: Insufficient documentation

## 2022-11-15 DIAGNOSIS — R109 Unspecified abdominal pain: Secondary | ICD-10-CM | POA: Insufficient documentation

## 2022-11-15 LAB — LIPASE, BLOOD: Lipase: 52 U/L — ABNORMAL HIGH (ref 11–51)

## 2022-11-15 LAB — COMPREHENSIVE METABOLIC PANEL
ALT: 23 U/L (ref 0–44)
AST: 28 U/L (ref 15–41)
Albumin: 3.9 g/dL (ref 3.5–5.0)
Alkaline Phosphatase: 98 U/L (ref 38–126)
Anion gap: 12 (ref 5–15)
BUN: 17 mg/dL (ref 8–23)
CO2: 23 mmol/L (ref 22–32)
Calcium: 9.4 mg/dL (ref 8.9–10.3)
Chloride: 100 mmol/L (ref 98–111)
Creatinine, Ser: 1.56 mg/dL — ABNORMAL HIGH (ref 0.61–1.24)
GFR, Estimated: 47 mL/min — ABNORMAL LOW (ref >60)
Glucose, Bld: 116 mg/dL — ABNORMAL HIGH (ref 70–99)
Potassium: 3.5 mmol/L (ref 3.5–5.1)
Sodium: 135 mmol/L (ref 135–145)
Total Bilirubin: 1.1 mg/dL (ref 0.3–1.2)
Total Protein: 7.8 g/dL (ref 6.5–8.1)

## 2022-11-15 LAB — CBC
HCT: 42.3 % (ref 39.0–52.0)
Hemoglobin: 14.7 g/dL (ref 13.0–17.0)
MCH: 33.5 pg (ref 26.0–34.0)
MCHC: 34.8 g/dL (ref 30.0–36.0)
MCV: 96.4 fL (ref 80.0–100.0)
Platelets: 135 10*3/uL — ABNORMAL LOW (ref 150–400)
RBC: 4.39 MIL/uL (ref 4.22–5.81)
RDW: 13.1 % (ref 11.5–15.5)
WBC: 3.9 10*3/uL — ABNORMAL LOW (ref 4.0–10.5)
nRBC: 0 % (ref 0.0–0.2)

## 2022-11-15 MED ORDER — ONDANSETRON 4 MG PO TBDP
4.0000 mg | ORAL_TABLET | Freq: Once | ORAL | Status: AC
  Administered 2022-11-15: 4 mg via ORAL

## 2022-11-15 NOTE — ED Triage Notes (Signed)
Pt arrived to triage complaining of abdominal pain and vomiting that started last night around 6pm.  Pt spitting up continuously during triage.  Pt denies abdominal surgical hx

## 2022-11-15 NOTE — ED Provider Triage Note (Signed)
Emergency Medicine Provider Triage Evaluation Note  Adrian [name]. , a 72 y.o. male  was evaluated in triage.  Pt complains of abdominal pain and vomiting for the past 12 hours. Ate a little dinner, symptoms started after. Hx of CAD with stents. Unknown number of emesis, hiccups.   Review of Systems  Positive: Abd pain, vomiting, hiccups Negative: Fever, diarrhea, CP  Physical Exam  BP (!) 141/86   Pulse 76   Temp 98.6 F (37 C)   Resp 16   Ht '5\' 10"'$  (1.778 m)   Wt 70.8 kg   BMI 22.38 kg/m  Gen:   Awake, no distress   Resp:  Normal effort  MSK:   Moves extremities without difficulty  Other:  Spitting continuously in triage  Medical Decision Making  Medically screening exam initiated at 5:58 AM.  Appropriate orders placed.  Adrian [name]. was informed that the remainder of the evaluation will be completed by another provider, this initial triage assessment does not replace that evaluation, and the importance of remaining in the ED until their evaluation is complete.  Workup initiated, given zofran   Brenden Rudman T, PA-C 11/15/22 [phone]

## 2022-11-15 NOTE — ED Notes (Addendum)
Patient called for room x3  

## 2022-11-16 ENCOUNTER — Emergency Department (HOSPITAL_COMMUNITY)
Admission: EM | Admit: 2022-11-16 | Discharge: 2022-11-16 | Disposition: A | Discharge status: Home / Self Care | Payer: Medicare Other | Attending: Emergency Medicine | Admitting: Emergency Medicine

## 2022-11-16 ENCOUNTER — Other Ambulatory Visit: Payer: Self-pay

## 2022-11-16 ENCOUNTER — Emergency Department (HOSPITAL_COMMUNITY): Payer: Medicare Other

## 2022-11-16 ENCOUNTER — Encounter (HOSPITAL_COMMUNITY): Payer: Self-pay | Admitting: Emergency Medicine

## 2022-11-16 DIAGNOSIS — Z7982 Long term (current) use of aspirin: Secondary | ICD-10-CM | POA: Insufficient documentation

## 2022-11-16 DIAGNOSIS — Z79899 Other long term (current) drug therapy: Secondary | ICD-10-CM | POA: Insufficient documentation

## 2022-11-16 DIAGNOSIS — K209 Esophagitis, unspecified without bleeding: Secondary | ICD-10-CM | POA: Diagnosis not present

## 2022-11-16 DIAGNOSIS — R0789 Other chest pain: Secondary | ICD-10-CM | POA: Diagnosis present

## 2022-11-16 LAB — BASIC METABOLIC PANEL
Anion gap: 11 (ref 5–15)
BUN: 21 mg/dL (ref 8–23)
CO2: 23 mmol/L (ref 22–32)
Calcium: 9.1 mg/dL (ref 8.9–10.3)
Chloride: 104 mmol/L (ref 98–111)
Creatinine, Ser: 1.4 mg/dL — ABNORMAL HIGH (ref 0.61–1.24)
GFR, Estimated: 54 mL/min — ABNORMAL LOW (ref >60)
Glucose, Bld: 107 mg/dL — ABNORMAL HIGH (ref 70–99)
Potassium: 3.6 mmol/L (ref 3.5–5.1)
Sodium: 138 mmol/L (ref 135–145)

## 2022-11-16 LAB — LIPASE, BLOOD: Lipase: 44 U/L (ref 11–51)

## 2022-11-16 LAB — CBC
HCT: 43.1 % (ref 39.0–52.0)
Hemoglobin: 14.4 g/dL (ref 13.0–17.0)
MCH: 33 pg (ref 26.0–34.0)
MCHC: 33.4 g/dL (ref 30.0–36.0)
MCV: 98.6 fL (ref 80.0–100.0)
Platelets: 146 10*3/uL — ABNORMAL LOW (ref 150–400)
RBC: 4.37 MIL/uL (ref 4.22–5.81)
RDW: 13.1 % (ref 11.5–15.5)
WBC: 4.9 10*3/uL (ref 4.0–10.5)
nRBC: 0 % (ref 0.0–0.2)

## 2022-11-16 LAB — TROPONIN I (HIGH SENSITIVITY)
Troponin I (High Sensitivity): 4 ng/L
Troponin I (High Sensitivity): 6 ng/L (ref <18)

## 2022-11-16 MED ORDER — IOHEXOL 350 MG/ML SOLN
75.0000 mL | Freq: Once | INTRAVENOUS | Status: AC | PRN
  Administered 2022-11-16: 75 mL via INTRAVENOUS

## 2022-11-16 MED ORDER — FAMOTIDINE 20 MG PO TABS: 20.0000 mg | ORAL_TABLET | Freq: Two times a day (BID) | ORAL | 0 refills | Status: AC

## 2022-11-16 MED ORDER — ALUM & MAG HYDROXIDE-SIMETH 200-200-20 MG/5ML PO SUSP
30.0000 mL | Freq: Once | ORAL | Status: AC
  Administered 2022-11-16: 30 mL via ORAL
  Filled 2022-11-16: qty 30

## 2022-11-16 MED ORDER — FAMOTIDINE IN NACL 20-0.9 MG/50ML-% IV SOLN
20.0000 mg | Freq: Once | INTRAVENOUS | Status: AC
  Administered 2022-11-16: 20 mg via INTRAVENOUS
  Filled 2022-11-16: qty 50

## 2022-11-16 MED ORDER — LIDOCAINE VISCOUS HCL 2 % MT SOLN
15.0000 mL | Freq: Once | OROMUCOSAL | Status: AC
  Administered 2022-11-16: 15 mL via ORAL
  Filled 2022-11-16: qty 15

## 2022-11-16 NOTE — Discharge Instructions (Signed)
You were seen for your esophagus irritation (esophagitis) in the emergency department.   At home, please take the Pepcid that you have been prescribed as well as Maalox and over-the-counter and acids for your chest discomfort.   Follow-up with your primary doctor in 2-3 days regarding your visit.  Follow-up with the GI doctors regarding her symptoms as well.  Return immediately to the emergency department if you experience any of the following: Chest pain worsened with walking, unexplained sweating, vomiting with the chest pain, or any other concerning symptoms.    Thank you for visiting our Emergency Department. It was a pleasure taking care of you today.

## 2022-11-16 NOTE — ED Triage Notes (Addendum)
Pt c/o intermittent chest pain for a couple days. Pt describes the pain as centralized, non radiating burning pain. Endorses shortness of breath. Pt also states he has hiccups for 4 days and he believes that may be causing his chest pain.

## 2022-11-16 NOTE — ED Provider Triage Note (Signed)
Emergency Medicine Provider Triage Evaluation Note  Adrian [name]. , a 72 y.o. male  was evaluated in triage.  Pt complains of central and epigastric chest pain.  Began 4 days ago.  Was seen recently for hiccups.  Believes this may be contributing to his pain.  It is described as a burning sensation.  Reports remote history of heart attack.  Denies associated shortness of breath.  Endorses nausea and vomiting for the past 4 days as well.  Pain is not exertional.  Review of Systems  Positive: As above Negative: As above  Physical Exam  BP 117/78 (BP Location: Right Arm)   Pulse 94   Temp 98.1 F (36.7 C) (Oral)   Resp 16   Ht 5\' 10"  (1.778 m)   Wt 72.6 kg   SpO2 100%   BMI 22.96 kg/m  Gen:   Awake, no distress   Resp:  Normal effort  MSK:   Moves extremities without difficulty  Other:    Medical Decision Making  Medically screening exam initiated at 6:31 PM.  Appropriate orders placed.  Rodrigus Manuela Neptune. was informed that the remainder of the evaluation will be completed by another provider, this initial triage assessment does not replace that evaluation, and the importance of remaining in the ED until their evaluation is complete.  ACS rule out initiated   Nehemiah Massed 11/16/22 [id number]

## 2022-11-16 NOTE — ED Provider Notes (Signed)
[facility] Provider Note   CSN: [id number] Arrival date & time: 11/16/22  1815     History {Add pertinent medical, surgical, social history, OB history to HPI:1} Chief Complaint  Patient presents with   Chest Pain    Adrian [name]. is a 72 y.o. male.  72 year old male with a history of MI status post PCI who presents the emergency department chest discomfort.  Patient reports that over the past several days has started develop chest discomfort.  Says that he has chest discomfort both with eating and with exertion.  Describes it as a substernal burning sensation.  Says that because of this he has not eaten in a week.  Says that it is intermittent.  Has had occasional vomiting over the past week as well.  Feels that he is lost approximately 15 pounds and has had hiccups over the past 4 days.  No shortness of breath.  No diarrhea.  No abdominal pain.  No smoking history.  Does drink alcohol.  Denies any NSAID use.  No cancer history.  Did have similar presentation in January when he had an EKG and troponins and chest x-ray that were unremarkable.       Home Medications Prior to Admission medications   Medication Sig Start Date End Date Taking? Authorizing Provider  aspirin EC 81 MG tablet Take 1 tablet (81 mg total) by mouth daily. 07/24/13   Richardson Dopp T, PA-C  atorvastatin (LIPITOR) 40 MG tablet Take 40 mg by mouth daily. 12/04/19   [provider]  baclofen 5 MG TABS Take 2 tablets (10 mg total) by mouth 2 (two) times daily as needed (hiccups). 10/03/22   Davonna Belling, MD  esomeprazole (NEXIUM) 20 MG capsule Take 20 mg by mouth daily as needed (for heartburn).    [provider]  losartan (COZAAR) 25 MG tablet Take 25 mg by mouth daily. 12/15/19   [provider]  metoprolol tartrate (LOPRESSOR) 25 MG tablet Take 25 mg by mouth daily.     [provider]  nitroGLYCERIN (NITROSTAT) 0.4 MG SL tablet  Place 1 tablet (0.4 mg total) under the tongue every 5 (five) minutes x 3 doses as needed for chest pain. 07/17/13   Theora Gianotti, NP  omeprazole (PRILOSEC) 20 MG capsule Take 1 capsule (20 mg total) by mouth daily. 10/03/22   Davonna Belling, MD  ondansetron (ZOFRAN ODT) 4 MG disintegrating tablet Take 1 tablet (4 mg total) by mouth every 8 (eight) hours as needed for nausea or vomiting. 04/30/20   Gareth Morgan, MD  oxyCODONE-acetaminophen (PERCOCET) 5-325 MG tablet Take 1 tablet by mouth every 8 (eight) hours as needed for moderate pain or severe pain. 11/05/21   Domenic Moras, PA-C      Allergies    Patient has no known allergies.    Review of Systems   Review of Systems  Physical Exam Updated Vital Signs BP 117/78 (BP Location: Right Arm)   Pulse 94   Temp 98.1 F (36.7 C) (Oral)   Resp 16   Ht 5\' 10"  (1.778 m)   Wt 72.6 kg   SpO2 100%   BMI 22.96 kg/m  Physical Exam Vitals and nursing note reviewed.  Constitutional:      General: He is not in acute distress.    Appearance: He is well-developed.  HENT:     Head: Normocephalic and atraumatic.     Right Ear: External ear normal.  Left Ear: External ear normal.     Nose: Nose normal.  Eyes:     Extraocular Movements: Extraocular movements intact.     Conjunctiva/sclera: Conjunctivae normal.     Pupils: Pupils are equal, round, and reactive to light.  Cardiovascular:     Rate and Rhythm: Normal rate and regular rhythm.     Heart sounds: Normal heart sounds.     Comments: Chest pain not reproducible Pulmonary:     Effort: Pulmonary effort is normal. No respiratory distress.     Breath sounds: Normal breath sounds.  Abdominal:     General: There is no distension.     Palpations: Abdomen is soft. There is no mass.     Tenderness: There is no abdominal tenderness. There is no guarding.  Musculoskeletal:     Cervical back: Normal range of motion and neck supple.     Right lower leg: No edema.     Left  lower leg: No edema.  Skin:    General: Skin is warm and dry.  Neurological:     Mental Status: He is alert. Mental status is at baseline.  Psychiatric:        Mood and Affect: Mood normal.        Behavior: Behavior normal.     ED Results / Procedures / Treatments   Labs (all labs ordered are listed, but only abnormal results are displayed) Labs Reviewed  BASIC METABOLIC PANEL - Abnormal; Notable for the following components:      Result Value   Glucose, Bld 107 (*)    Creatinine, Ser 1.40 (*)    GFR, Estimated 54 (*)    All other components within normal limits  CBC - Abnormal; Notable for the following components:   Platelets 146 (*)    All other components within normal limits  LIPASE, BLOOD  TROPONIN I (HIGH SENSITIVITY)  TROPONIN I (HIGH SENSITIVITY)    EKG EKG Interpretation  Date/Time:  Friday November 16 2022 18:21:46 EDT Ventricular Rate:  93 PR Interval:  146 QRS Duration: 80 QT Interval:  340 QTC Calculation: 422 R Axis:   64 Text Interpretation: Normal sinus rhythm Normal ECG When compared with ECG of 03-Oct-2022 12:32, PREVIOUS ECG IS PRESENT Confirmed by Margaretmary Eddy [phone]) on 11/16/2022 8:26:00 PM  Radiology DG Chest 2 View  Result Date: 11/16/2022 CLINICAL DATA:  Chest pain EXAM: CHEST - 2 VIEW COMPARISON:  Chest x-ray dated October 03, 2022 FINDINGS: The heart size and mediastinal contours are within normal limits. Both lungs are clear. The visualized skeletal structures are unremarkable. IMPRESSION: No active cardiopulmonary disease. Electronically Signed   By: Yetta Glassman M.D.   On: 11/16/2022 18:57    Procedures Procedures  {Document cardiac monitor, telemetry assessment procedure when appropriate:1}  Medications Ordered in ED Medications - No data to display  ED Course/ Medical Decision Making/ A&P   {   Click here for ABCD2, HEART and other calculatorsREFRESH Note before signing :1}                          Medical Decision  Making Amount and/or Complexity of Data Reviewed Radiology: ordered.  Risk OTC drugs. Prescription drug management.   ***  {Document critical care time when appropriate:1} {Document review of labs and clinical decision tools ie heart score, Chads2Vasc2 etc:1}  {Document your independent review of radiology images, and any outside records:1} {Document your discussion with family members, caretakers, and with consultants:1} {Document  social determinants of health affecting pt's care:1} {Document your decision making why or why not admission, treatments were needed:1} Final Clinical Impression(s) / ED Diagnoses Final diagnoses:  None    Rx / DC Orders ED Discharge Orders     None
